# Patient Record
Sex: Male | Born: 1980 | Race: Black or African American | Hispanic: No | Marital: Single | Smoking: Current every day smoker
Health system: Southern US, Community
[De-identification: ages and names within clinical notes are randomized; demographics above are authoritative.]

## PROBLEM LIST (undated history)

## (undated) DIAGNOSIS — M545 Low back pain, unspecified: Secondary | ICD-10-CM

## (undated) DIAGNOSIS — J45909 Unspecified asthma, uncomplicated: Secondary | ICD-10-CM

## (undated) DIAGNOSIS — T7840XA Allergy, unspecified, initial encounter: Secondary | ICD-10-CM

## (undated) HISTORY — DX: Unspecified asthma, uncomplicated: J45.909

## (undated) HISTORY — DX: Allergy, unspecified, initial encounter: T78.40XA

## (undated) HISTORY — PX: HAND SURGERY: SHX662

---
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
## 2004-09-22 ENCOUNTER — Emergency Department (HOSPITAL_COMMUNITY): Admission: EM | Admit: 2004-09-22 | Discharge: 2004-09-22 | Payer: Self-pay | Admitting: Emergency Medicine

## 2007-10-10 ENCOUNTER — Emergency Department (HOSPITAL_COMMUNITY): Admission: EM | Admit: 2007-10-10 | Discharge: 2007-10-11 | Payer: Self-pay | Admitting: Emergency Medicine

## 2007-11-13 ENCOUNTER — Emergency Department (HOSPITAL_COMMUNITY): Admission: EM | Admit: 2007-11-13 | Discharge: 2007-11-14 | Payer: Self-pay | Admitting: Emergency Medicine

## 2007-11-13 ENCOUNTER — Emergency Department (HOSPITAL_COMMUNITY): Admission: EM | Admit: 2007-11-13 | Discharge: 2007-11-13 | Payer: Self-pay | Admitting: Emergency Medicine

## 2008-06-19 ENCOUNTER — Emergency Department (HOSPITAL_COMMUNITY): Admission: EM | Admit: 2008-06-19 | Discharge: 2008-06-20 | Payer: Self-pay | Admitting: Emergency Medicine

## 2008-08-01 ENCOUNTER — Emergency Department (HOSPITAL_COMMUNITY): Admission: EM | Admit: 2008-08-01 | Discharge: 2008-08-01 | Payer: Self-pay | Admitting: Emergency Medicine

## 2010-03-25 ENCOUNTER — Emergency Department (HOSPITAL_COMMUNITY)
Admission: EM | Admit: 2010-03-25 | Discharge: 2010-03-25 | Payer: Self-pay | Source: Home / Self Care | Admitting: Emergency Medicine

## 2010-06-29 ENCOUNTER — Emergency Department (HOSPITAL_COMMUNITY)
Admission: EM | Admit: 2010-06-29 | Discharge: 2010-06-29 | Disposition: A | Discharge status: Home / Self Care | Payer: Worker's Compensation | Attending: Emergency Medicine | Admitting: Emergency Medicine

## 2010-06-29 ENCOUNTER — Emergency Department (HOSPITAL_COMMUNITY): Payer: Worker's Compensation

## 2010-06-29 DIAGNOSIS — M25569 Pain in unspecified knee: Secondary | ICD-10-CM | POA: Insufficient documentation

## 2010-06-29 DIAGNOSIS — M25669 Stiffness of unspecified knee, not elsewhere classified: Secondary | ICD-10-CM | POA: Insufficient documentation

## 2010-06-29 DIAGNOSIS — I10 Essential (primary) hypertension: Secondary | ICD-10-CM | POA: Insufficient documentation

## 2010-06-29 DIAGNOSIS — IMO0002 Reserved for concepts with insufficient information to code with codable children: Secondary | ICD-10-CM | POA: Insufficient documentation

## 2010-06-29 DIAGNOSIS — S8000XA Contusion of unspecified knee, initial encounter: Secondary | ICD-10-CM | POA: Insufficient documentation

## 2010-07-15 LAB — URINALYSIS, ROUTINE W REFLEX MICROSCOPIC
Glucose, UA: NEGATIVE mg/dL
Nitrite: NEGATIVE
Specific Gravity, Urine: 1.025 (ref 1.005–1.030)
pH: 6 (ref 5.0–8.0)

## 2010-07-15 LAB — DIFFERENTIAL
Lymphocytes Relative: 27 % (ref 12–46)
Lymphs Abs: 2.3 10*3/uL (ref 0.7–4.0)
Monocytes Relative: 5 % (ref 3–12)
Neutrophils Relative %: 62 % (ref 43–77)

## 2010-07-15 LAB — COMPREHENSIVE METABOLIC PANEL WITH GFR
ALT: 10 U/L (ref 0–53)
AST: 19 U/L (ref 0–37)
Albumin: 3.8 g/dL (ref 3.5–5.2)
Alkaline Phosphatase: 67 U/L (ref 39–117)
BUN: 11 mg/dL (ref 6–23)
CO2: 31 meq/L (ref 19–32)
Calcium: 9.3 mg/dL (ref 8.4–10.5)
Chloride: 104 meq/L (ref 96–112)
Creatinine, Ser: 0.95 mg/dL (ref 0.4–1.5)
GFR calc non Af Amer: >60 mL/min
Glucose, Bld: 91 mg/dL (ref 70–99)
Potassium: 4.2 meq/L (ref 3.5–5.1)
Sodium: 140 meq/L (ref 135–145)
Total Bilirubin: 0.5 mg/dL (ref 0.3–1.2)
Total Protein: 6.9 g/dL (ref 6.0–8.3)

## 2010-07-15 LAB — LIPASE, BLOOD: Lipase: 40 U/L (ref 11–59)

## 2010-07-15 LAB — CBC
MCHC: 33.5 g/dL (ref 30.0–36.0)
MCV: 87.1 fL (ref 78.0–100.0)
RBC: 4.91 MIL/uL (ref 4.22–5.81)
RDW: 14.4 % (ref 11.5–15.5)

## 2010-07-15 LAB — URINE MICROSCOPIC-ADD ON

## 2010-07-16 LAB — POCT I-STAT, CHEM 8
Creatinine, Ser: 1.3 mg/dL (ref 0.4–1.5)
HCT: 47 % (ref 39.0–52.0)
Hemoglobin: 16 g/dL (ref 13.0–17.0)
Sodium: 139 mEq/L (ref 135–145)
TCO2: 30 mmol/L (ref 0–100)

## 2010-07-23 ENCOUNTER — Emergency Department (HOSPITAL_COMMUNITY)
Admission: EM | Admit: 2010-07-23 | Discharge: 2010-07-23 | Disposition: A | Discharge status: Home / Self Care | Payer: Self-pay | Attending: Emergency Medicine | Admitting: Emergency Medicine

## 2010-07-23 DIAGNOSIS — M545 Low back pain, unspecified: Secondary | ICD-10-CM | POA: Insufficient documentation

## 2010-07-23 DIAGNOSIS — I10 Essential (primary) hypertension: Secondary | ICD-10-CM | POA: Insufficient documentation

## 2010-07-23 DIAGNOSIS — G44209 Tension-type headache, unspecified, not intractable: Secondary | ICD-10-CM | POA: Insufficient documentation

## 2010-07-23 DIAGNOSIS — S335XXA Sprain of ligaments of lumbar spine, initial encounter: Secondary | ICD-10-CM | POA: Insufficient documentation

## 2010-07-23 DIAGNOSIS — F17200 Nicotine dependence, unspecified, uncomplicated: Secondary | ICD-10-CM | POA: Insufficient documentation

## 2010-07-23 DIAGNOSIS — X58XXXA Exposure to other specified factors, initial encounter: Secondary | ICD-10-CM | POA: Insufficient documentation

## 2011-01-01 LAB — CBC
MCHC: 32.8
RBC: 4.55
WBC: 10.4

## 2011-01-01 LAB — DIFFERENTIAL
Basophils Relative: 0
Lymphocytes Relative: 30
Monocytes Relative: 6
Neutro Abs: 6.3
Neutrophils Relative %: 60

## 2013-03-13 ENCOUNTER — Encounter (HOSPITAL_COMMUNITY): Payer: Self-pay | Admitting: Emergency Medicine

## 2013-03-13 ENCOUNTER — Emergency Department (HOSPITAL_COMMUNITY)
Admission: EM | Admit: 2013-03-13 | Discharge: 2013-03-13 | Disposition: A | Discharge status: Home / Self Care | Payer: Self-pay | Attending: Emergency Medicine | Admitting: Emergency Medicine

## 2013-03-13 DIAGNOSIS — M545 Low back pain, unspecified: Secondary | ICD-10-CM | POA: Insufficient documentation

## 2013-03-13 DIAGNOSIS — A749 Chlamydial infection, unspecified: Secondary | ICD-10-CM | POA: Insufficient documentation

## 2013-03-13 DIAGNOSIS — Z202 Contact with and (suspected) exposure to infections with a predominantly sexual mode of transmission: Secondary | ICD-10-CM

## 2013-03-13 DIAGNOSIS — F17200 Nicotine dependence, unspecified, uncomplicated: Secondary | ICD-10-CM | POA: Insufficient documentation

## 2013-03-13 DIAGNOSIS — G8929 Other chronic pain: Secondary | ICD-10-CM | POA: Insufficient documentation

## 2013-03-13 LAB — URINALYSIS, ROUTINE W REFLEX MICROSCOPIC
Hgb urine dipstick: NEGATIVE
Nitrite: NEGATIVE
Specific Gravity, Urine: 1.027 (ref 1.005–1.030)
Urobilinogen, UA: 1 mg/dL (ref 0.0–1.0)

## 2013-03-13 LAB — GC/CHLAMYDIA PROBE AMP
CT Probe RNA: POSITIVE — AB
GC Probe RNA: NEGATIVE

## 2013-03-13 LAB — URINE MICROSCOPIC-ADD ON

## 2013-03-13 MED ORDER — AZITHROMYCIN 250 MG PO TABS
1000.0000 mg | ORAL_TABLET | Freq: Once | ORAL | Status: AC
  Administered 2013-03-13: 1000 mg via ORAL
  Filled 2013-03-13: qty 4

## 2013-03-13 MED ORDER — CEFTRIAXONE SODIUM 250 MG IJ SOLR
250.0000 mg | Freq: Once | INTRAMUSCULAR | Status: AC
  Administered 2013-03-13: 250 mg via INTRAMUSCULAR
  Filled 2013-03-13: qty 250

## 2013-03-13 MED ORDER — METRONIDAZOLE 500 MG PO TABS
2000.0000 mg | ORAL_TABLET | Freq: Once | ORAL | Status: AC
  Administered 2013-03-13: 2000 mg via ORAL
  Filled 2013-03-13: qty 4

## 2013-03-13 MED ORDER — CYCLOBENZAPRINE HCL 10 MG PO TABS: 10.0000 mg | ORAL_TABLET | Freq: Three times a day (TID) | ORAL | Status: DC | PRN

## 2013-03-13 MED ORDER — LIDOCAINE HCL (PF) 1 % IJ SOLN
INTRAMUSCULAR | Status: AC
  Administered 2013-03-13: 0.9 mL
  Filled 2013-03-13: qty 5

## 2013-03-13 NOTE — ED Notes (Signed)
Pt state that for several years he has been having back pain off and on. Pt also believes that he has chlamydia because the person that gave it to him called him to tell him that she had it. Pt states that he believes it is affecting his kidney because when he urinates it is dark and strong.

## 2013-03-13 NOTE — ED Notes (Addendum)
C/o low back pain, recurrent, chronic. Also here for penile d/c, urinary frequency, malodorous urine & dysuria, onset 3-4d ago. (Denies: fever, nvd), no meds PTA. Pt "believes sx to be chlamydia".

## 2013-03-13 NOTE — ED Provider Notes (Signed)
Medical screening examination/treatment/procedure(s) were performed by non-physician practitioner and as supervising physician I was immediately available for consultation/collaboration.    Olivia Mackie, MD 03/13/13 [phone]

## 2013-03-13 NOTE — ED Provider Notes (Signed)
CSN: [id number]     Arrival date & time 03/13/13  0017 History   First MD Initiated Contact with Patient 03/13/13 0041     Chief Complaint  Patient presents with  . Back Pain  . Penile Discharge   HPI  History provided by the patient. Patient is a 32 year old male presents with concerns for STD exposure and penial discharge. Patient reports having penile discharge with slight motor for the past 4 days. He reports recently being notified by a sexual partner that she had positive Chlamydia. Patient believes he also has Chlamydia symptoms. He has slight dysuria. Denies any hematuria. Denies any rash of the skin. Denies any recent fever, chills or sweats and some continued chronic low back pain. He reports having this for many years and was told this was related to muscle strain and from his work as a Special educational needs teacher. Patient denies any specific strenuous activity were to worsen his pain does complain of low back pain and soreness worse with movement. This does not radiate. Denies any weakness or numbness in extremities. No urinary or fecal incontinence, urinary retention or perineal numbness.    History reviewed. No pertinent past medical history. Past Surgical History  Procedure Laterality Date  . Hand surgery Right    History reviewed. No pertinent family history. History  Substance Use Topics  . Smoking status: Current Every Day Smoker  . Smokeless tobacco: Not on file  . Alcohol Use: No    Review of Systems  Constitutional: Negative for fever, chills and diaphoresis.  All other systems reviewed and are negative.    Allergies  Review of patient's allergies indicates no known allergies.  Home Medications  No current outpatient prescriptions on file. BP 150/82  Pulse 90  Temp(Src) 97.3 F (36.3 C) (Oral)  Resp 18  Ht 6\' 2"  (1.88 m)  Wt 197 lb 5 oz (89.5 kg)  BMI 25.32 kg/m2  SpO2 96% Physical Exam  Nursing note and vitals reviewed. Constitutional: He appears  well-developed and well-nourished.  HENT:  Head: Normocephalic.  Cardiovascular: Normal rate and regular rhythm.   Pulmonary/Chest: Effort normal and breath sounds normal. No respiratory distress.  Abdominal: Soft. There is no tenderness. There is no CVA tenderness.  Genitourinary: Testes normal. Discharge found.  Musculoskeletal:       Lumbar back: He exhibits tenderness. He exhibits no bony tenderness, no swelling and no deformity.       Back:  Neurological: He is alert.  Skin: Skin is warm.  Psychiatric: He has a normal mood and affect. His behavior is normal.    ED Course  Procedures   COORDINATION OF CARE:  Nursing notes reviewed. Vital signs reviewed. Initial pt interview and examination performed.   Patient seen and evaluated. Patient well appearing. Patient reports exposure to partner with chlamydia. Having penile discharge. Discussed work up plan with pt at bedside, which includes UA, GC Chlamydia. Pt agrees with plan.  UA does demonstrate increased WBC. Significant bacteria. Patient was history consistent with STD exposure. This is likely a cause of increased WBC. At this time will treat for gonorrhea, Chlamydia and Trichomonas. We'll wait for urine cultures to assess need for any additional treatment of UTI. Patient instructed followup with [facility] department and agrees.   Treatment plan initiated: Medications  cefTRIAXone (ROCEPHIN) injection 250 mg (250 mg Intramuscular Given 03/13/13 0202)  azithromycin (ZITHROMAX) tablet 1,000 mg (1,000 mg Oral Given 03/13/13 0202)  metroNIDAZOLE (FLAGYL) tablet 2,000 mg (2,000 mg Oral Given 03/13/13 0202)  lidocaine (PF) (XYLOCAINE) 1 % injection (0.9 mLs  Given 03/13/13 0208)     Results for orders placed during the hospital encounter of 03/13/13  URINALYSIS, ROUTINE W REFLEX MICROSCOPIC      Result Value Range   Color, Urine YELLOW  YELLOW   APPearance CLOUDY (*) CLEAR   Specific Gravity, Urine 1.027  1.005 -  1.030   pH 7.0  5.0 - 8.0   Glucose, UA NEGATIVE  NEGATIVE mg/dL   Hgb urine dipstick NEGATIVE  NEGATIVE   Bilirubin Urine NEGATIVE  NEGATIVE   Ketones, ur NEGATIVE  NEGATIVE mg/dL   Protein, ur NEGATIVE  NEGATIVE mg/dL   Urobilinogen, UA 1.0  0.0 - 1.0 mg/dL   Nitrite NEGATIVE  NEGATIVE   Leukocytes, UA SMALL (*) NEGATIVE  URINE MICROSCOPIC-ADD ON      Result Value Range   Squamous Epithelial / LPF RARE  RARE   WBC, UA 11-20  <3 WBC/hpf   RBC / HPF 0-2  <3 RBC/hpf   Bacteria, UA RARE  RARE   Urine-Other MUCOUS PRESENT        MDM   1. Exposure to STD   2. Chlamydia   3. Low back pain        Angus Seller, PA-C 03/13/13 0502

## 2013-03-15 NOTE — ED Notes (Signed)
+   Chlamydia Patient treated with Rocephin And Zithromax-[facility] faxed 

## 2013-03-16 ENCOUNTER — Telehealth (HOSPITAL_COMMUNITY): Payer: Self-pay | Admitting: Emergency Medicine

## 2013-03-16 NOTE — ED Notes (Signed)
Unable to contact patient via phone. Sent letter. °

## 2013-04-05 ENCOUNTER — Emergency Department (HOSPITAL_COMMUNITY)
Admission: EM | Admit: 2013-04-05 | Discharge: 2013-04-05 | Disposition: A | Discharge status: Home / Self Care | Payer: Self-pay | Attending: Emergency Medicine | Admitting: Emergency Medicine

## 2013-04-05 ENCOUNTER — Encounter (HOSPITAL_COMMUNITY): Payer: Self-pay | Admitting: Emergency Medicine

## 2013-04-05 DIAGNOSIS — Z202 Contact with and (suspected) exposure to infections with a predominantly sexual mode of transmission: Secondary | ICD-10-CM

## 2013-04-05 DIAGNOSIS — Z8619 Personal history of other infectious and parasitic diseases: Secondary | ICD-10-CM | POA: Insufficient documentation

## 2013-04-05 DIAGNOSIS — F17200 Nicotine dependence, unspecified, uncomplicated: Secondary | ICD-10-CM | POA: Insufficient documentation

## 2013-04-05 MED ORDER — AZITHROMYCIN 250 MG PO TABS
1000.0000 mg | ORAL_TABLET | Freq: Once | ORAL | Status: AC
  Administered 2013-04-05: 1000 mg via ORAL
  Filled 2013-04-05: qty 4

## 2013-04-05 MED ORDER — LIDOCAINE HCL (PF) 1 % IJ SOLN
5.0000 mL | Freq: Once | INTRAMUSCULAR | Status: AC
  Administered 2013-04-05: 5 mL
  Filled 2013-04-05: qty 5

## 2013-04-05 MED ORDER — CEFTRIAXONE SODIUM 250 MG IJ SOLR
250.0000 mg | Freq: Once | INTRAMUSCULAR | Status: AC
  Administered 2013-04-05: 250 mg via INTRAMUSCULAR
  Filled 2013-04-05: qty 250

## 2013-04-05 NOTE — ED Provider Notes (Signed)
Medical screening examination/treatment/procedure(s) were performed by non-physician practitioner and as supervising physician I was immediately available for consultation/collaboration.  EKG Interpretation   None         Maili Shutters B. Bernette MayersSheldon, MD 04/05/13 [phone]

## 2013-04-05 NOTE — ED Provider Notes (Signed)
CSN: [id number]     Arrival date & time 04/05/13  [id number] History  This chart was scribed for non-physician practitioner Fayrene HelperBowie Alexys Gassett, PA-C, working with Bonnita Levanharles B. Bernette MayersSheldon, MD, by Yevette EdwardsAngela Bracken, ED Scribe. This patient was seen in room TR05C/TR05C and the patient's care was started at 9:35 AM.  First MD Initiated Contact with Patient 04/05/13 0930     Chief Complaint  Patient presents with  . Exposure to STD   The history is provided by the patient. No language interpreter was used.   HPI Comments: Donia PoundsBrandon C Pesantez is a 33 y.o. male who presents to the Emergency Department complaining of a possible STD exposure. The pt was diagnosed and treated for chlamydia approximately a month ago after experiencing penile discharge and penile pain for approximately a week. After receiving treatment, he had sexual intercourse two days later with the same sexual partner, and he is concerned that he may have contracted chlamydia again. He denies penile discharge, penile pain, back pain, nausea, emesis, fever, chills, or a rash. He is a current smoker.   History reviewed. No pertinent past medical history. Past Surgical History  Procedure Laterality Date  . Hand surgery Right    History reviewed. No pertinent family history. History  Substance Use Topics  . Smoking status: Current Every Day Smoker  . Smokeless tobacco: Not on file  . Alcohol Use: No    Review of Systems  Constitutional: Negative for fever and chills.  Gastrointestinal: Negative for abdominal pain.  Genitourinary: Negative for discharge and penile pain.  Musculoskeletal: Negative for back pain.  Skin: Negative for rash.  All other systems reviewed and are negative.    Allergies  Review of patient's allergies indicates no known allergies.  Home Medications   Current Outpatient Rx  Name  Route  Sig  Dispense  Refill  . cyclobenzaprine (FLEXERIL) 10 MG tablet   Oral   Take 1 tablet (10 mg total) by mouth 3 (three) times daily  as needed for muscle spasms.   30 tablet   0    Triage Vitals: BP 152/87  Pulse 94  Temp(Src) 98.2 F (36.8 C) (Oral)  Resp 18  SpO2 98%  Physical Exam  Nursing note and vitals reviewed. Constitutional: He is oriented to person, place, and time. He appears well-developed and well-nourished. No distress.  HENT:  Head: Normocephalic and atraumatic.  Eyes: EOM are normal.  Neck: Neck supple. No tracheal deviation present.  Cardiovascular: Normal rate, regular rhythm, normal heart sounds and intact distal pulses.   No murmur heard. Pulmonary/Chest: Effort normal. No respiratory distress. He has no rales.  Mild rhonchi, only with inspiration.   Musculoskeletal: Normal range of motion.  Neurological: He is alert and oriented to person, place, and time.  Skin: Skin is warm and dry.  Psychiatric: He has a normal mood and affect. His behavior is normal.    ED Course  Procedures (including critical care time)  DIAGNOSTIC STUDIES: Oxygen Saturation is 98% on room air, normal by my interpretation.    COORDINATION OF CARE:  9:38 AM- Discussed treatment plan with patient, which includes treatment for chlamydia and the reminder to avoid sexual activity for at least a week, and the patient agreed to the plan.   Labs Review Labs Reviewed - No data to display Imaging Review No results found.  EKG Interpretation   None       MDM   1. Possible exposure to STD    BP 152/87  Pulse 94  Temp(Src) 98.2 F (36.8 C) (Oral)  Resp 18  SpO2 98%   I personally performed the services described in this documentation, which was scribed in my presence. The recorded information has been reviewed and is accurate.     Fayrene Helper, PA-C 04/05/13 1601

## 2013-04-05 NOTE — ED Notes (Signed)
Pt sts requesting STD check; pt sts hx of chlamydia

## 2013-04-05 NOTE — Discharge Instructions (Signed)
Sexually Transmitted Disease °Sexually transmitted disease (STD) refers to any infection that is passed from person to person during sexual activity. This may happen by way of saliva, semen, blood, vaginal mucus, or urine. Common STDs include: °· Gonorrhea. °· Chlamydia. °· Syphilis. °· HIV/AIDS. °· Genital herpes. °· Hepatitis B and C. °· Trichomonas. °· Human papillomavirus (HPV). °· Pubic lice. °CAUSES  °An STD may be spread by bacteria, virus, or parasite. A person can get an STD by: °· Sexual intercourse with an infected person. °· Sharing sex toys with an infected person. °· Sharing needles with an infected person. °· Having intimate contact with the genitals, mouth, or rectal areas of an infected person. °SYMPTOMS  °Some people may not have any symptoms, but they can still pass the infection to others. Different STDs have different symptoms. Symptoms include: °· Painful or bloody urination. °· Pain in the pelvis, abdomen, vagina, anus, throat, or eyes. °· Skin rash, itching, irritation, growths, or sores (lesions). These usually occur in the genital or anal area. °· Abnormal vaginal discharge. °· Penile discharge in men. °· Soft, flesh-colored skin growths in the genital or anal area. °· Fever. °· Pain or bleeding during sexual intercourse. °· Swollen glands in the groin area. °· Yellow skin and eyes (jaundice). This is seen with hepatitis. °DIAGNOSIS  °To make a diagnosis, your caregiver may: °· Take a medical history. °· Perform a physical exam. °· Take a specimen (culture) to be examined. °· Examine a sample of discharge under a microscope. °· Perform blood tests. °· Perform a Pap test, if this applies. °· Perform a colposcopy. °· Perform a laparoscopy. °TREATMENT  °· Chlamydia, gonorrhea, trichomonas, and syphilis can be cured with antibiotic medicine. °· Genital herpes, hepatitis, and HIV can be treated, but not cured, with prescribed medicines. The medicines will lessen the symptoms. °· Genital warts  from HPV can be treated with medicine or by freezing, burning (electrocautery), or surgery. Warts may come back. °· HPV is a virus and cannot be cured with medicine or surgery. However, abnormal areas may be followed very closely by your caregiver and may be removed from the cervix, vagina, or vulva through office procedures or surgery. °If your diagnosis is confirmed, your recent sexual partners need treatment. This is true even if they are symptom-free or have a negative culture or evaluation. They should not have sex until their caregiver says it is okay. °HOME CARE INSTRUCTIONS °· All sexual partners should be informed, tested, and treated for all STDs. °· Take your antibiotics as directed. Finish them even if you start to feel better. °· Only take over-the-counter or prescription medicines for pain, discomfort, or fever as directed by your caregiver. °· Rest. °· Eat a balanced diet and drink enough fluids to keep your urine clear or pale yellow. °· Do not have sex until treatment is completed and you have followed up with your caregiver. STDs should be checked after treatment. °· Keep all follow-up appointments, Pap tests, and blood tests as directed by your caregiver. °· Only use latex condoms and water-soluble lubricants during sexual activity. Do not use petroleum jelly or oils. °· Avoid alcohol and illegal drugs. °· Get vaccinated for HPV and hepatitis. If you have not received these vaccines in the past, talk to your caregiver about whether one or both might be right for you. °· Avoid risky sex practices that can break the skin. °The only way to avoid getting an STD is to avoid all sexual activity. Latex condoms and dental   dams (for oral sex) will help lessen the risk of getting an STD, but will not completely eliminate the risk. °SEEK MEDICAL CARE IF:  °· You have a fever. °· You have any new or worsening symptoms. °Document Released: 06/12/2002 Document Revised: 06/14/2011 Document Reviewed:  10/10/2012 °[employer]® Patient Information ©2014 [employer]. ° °

## 2013-10-10 ENCOUNTER — Encounter (HOSPITAL_COMMUNITY): Payer: Self-pay | Admitting: Emergency Medicine

## 2013-10-10 ENCOUNTER — Emergency Department (HOSPITAL_COMMUNITY)
Admission: EM | Admit: 2013-10-10 | Discharge: 2013-10-10 | Disposition: A | Discharge status: Home / Self Care | Payer: Self-pay | Attending: Emergency Medicine | Admitting: Emergency Medicine

## 2013-10-10 DIAGNOSIS — F17200 Nicotine dependence, unspecified, uncomplicated: Secondary | ICD-10-CM | POA: Insufficient documentation

## 2013-10-10 DIAGNOSIS — M545 Low back pain, unspecified: Secondary | ICD-10-CM | POA: Insufficient documentation

## 2013-10-10 LAB — CBG MONITORING, ED: GLUCOSE-CAPILLARY: 107 mg/dL — AB (ref 70–99)

## 2013-10-10 MED ORDER — HYDROCODONE-ACETAMINOPHEN 5-325 MG PO TABS: 1.0000 | ORAL_TABLET | ORAL | Status: DC | PRN

## 2013-10-10 MED ORDER — NAPROXEN 500 MG PO TABS: 500.0000 mg | ORAL_TABLET | Freq: Two times a day (BID) | ORAL | Status: DC

## 2013-10-10 NOTE — ED Provider Notes (Signed)
Medical screening examination/treatment/procedure(s) were performed by non-physician practitioner and as supervising physician I was immediately available for consultation/collaboration.   EKG Interpretation None        Nithin Demeo M Judy Pollman, MD 10/10/13 1554 

## 2013-10-10 NOTE — ED Provider Notes (Signed)
CSN: [id number]     Arrival date & time 10/10/13  0841 History   First MD Initiated Contact with Patient 10/10/13 [phone]     Chief Complaint  Patient presents with  . Back Pain   HPI Comments: Thomas [name] is a 33 y.o. male who presents to the Emergency Department complaining of recurrent lower back pain x 3 days. The patient reports similar discomfort in the past several years.  Pt reports pain worsens with bending forward and certain movements. Pt denies recent injuries. States he does heavy lifting at work daily. Pt takes about 4 ibuprofen at a times without relief to pain. Pt denies wearing back brace. Pt denies numbness and weakness in leg. Denies injury. Pt shows concerned of DM and is requesting a CBG. He reports family h/o DM. Pt reports increase in thirst.   Patient is a 33 y.o. male presenting with back pain. The history is provided by the patient. No language interpreter was used.  Back Pain Associated symptoms: no fever, no numbness and no weakness    This chart was scribed for non-physician practitioner, Mellody DrownLauren Patrich Heinze, PA-C working with Lyanne CoKevin M Campos, MD, by Andrew Auaven Small, ED Scribe. This patient was seen in room TR06C/TR06C and the patient's care was started at 9:28 AM.   History reviewed. No pertinent past medical history. Past Surgical History  Procedure Laterality Date  . Hand surgery Right    History reviewed. No pertinent family history. History  Substance Use Topics  . Smoking status: Current Every Day Smoker  . Smokeless tobacco: Not on file  . Alcohol Use: No    Review of Systems  Constitutional: Negative for fever and chills.  Musculoskeletal: Positive for back pain. Negative for gait problem.  Skin: Negative for wound.  Neurological: Negative for weakness and numbness.  All other systems reviewed and are negative.  Allergies  Review of patient's allergies indicates no known allergies.  Home Medications   Prior to Admission medications   Not on File    BP 135/75  Pulse 97  Temp(Src) 98.3 F (36.8 C)  Resp 18  Wt 189 lb (85.73 kg)  SpO2 99% Physical Exam  Nursing note and vitals reviewed. Constitutional: He is oriented to person, place, and time. He appears well-developed and well-nourished.  Non-toxic appearance. He does not have a sickly appearance. He does not appear ill. No distress.  HENT:  Head: Normocephalic and atraumatic.  Eyes: EOM are normal. Pupils are equal, round, and reactive to light.  Neck: Normal range of motion. Neck supple.  Pulmonary/Chest: Effort normal. No respiratory distress.  Musculoskeletal: Normal range of motion.  No midline C-spine, T-spine, or L-spine tenderness with no step-offs, crepitus, or deformities noted. Mild tenderness palpation of paravertebral muscles, no spasms. Left lower lumbar soft tissue mobile mass, nontender. Good and equal strength and sensation to bilateral lower extremities.   Neurological: He is alert and oriented to person, place, and time.  Skin: Skin is warm and dry. He is not diaphoretic.  Psychiatric: He has a normal mood and affect. His behavior is normal.    ED Course  Procedures  DIAGNOSTIC STUDIES: Oxygen Saturation is 99% on RA, normal by my interpretation.    COORDINATION OF CARE: 9:31 AM-Discussed treatment plan which includes CBG with pt at bedside and pt agreed to plan.   CBG 107   MDM   Final diagnoses:  Bilateral low back pain without sciatica   Patient with back pain.  No neurological deficits and normal neuro  exam.  Likely strain  No loss of bowel or bladder control.  No concern for cauda equina.  No fever, night sweats, weight loss, h/o cancer, IVDU.  RICE protocol and pain medicine indicated and discussed with patient. Patient reports family history of diabetes, denies diabetes symptoms discussed A1c and followup with PCP. Will obtain CBG. CBG 107. PCP resources given.  Meds given in ED:  Medications - No data to display  Discharge Medication  List as of 10/10/2013  9:43 AM    START taking these medications   Details  HYDROcodone-acetaminophen (NORCO/VICODIN) 5-325 MG per tablet Take 1 tablet by mouth every 4 (four) hours as needed for moderate pain or severe pain., Starting 10/10/2013, Until Discontinued, Print    naproxen (NAPROSYN) 500 MG tablet Take 1 tablet (500 mg total) by mouth 2 (two) times daily with a meal., Starting 10/10/2013, Until Discontinued, Print        I personally performed the services described in this documentation, which was scribed in my presence. The recorded information has been reviewed and is accurate.      Clabe SealLauren M Karole Oo, PA-C 10/10/13 1046

## 2013-10-10 NOTE — Discharge Instructions (Signed)
Your blood glucose was 107, which is well within the normal limits for a non-fasting blood glucose testing. Call for a follow up appointment with a Family or Primary Care Provider.  Return if Symptoms worsen.   Take medication as prescribed.  Ice your back 3-4 times a day.    Emergency Department Resource Guide 1) Find a Doctor and Pay Out of Pocket Although you won't have to find out who is covered by your insurance plan, it is a good idea to ask around and get recommendations. You will then need to call the office and see if the doctor you have chosen will accept you as a new patient and what types of options they offer for patients who are self-pay. Some doctors offer discounts or will set up payment plans for their patients who do not have insurance, but you will need to ask so you aren't surprised when you get to your appointment.  2) Contact Your Local Health Department Not all health departments have doctors that can see patients for sick visits, but many do, so it is worth a call to see if yours does. If you don't know where your local health department is, you can check in your phone book. The CDC also has a tool to help you locate your state's health department, and many state websites also have listings of all of their local health departments.  3) Find a Walk-in Clinic If your illness is not likely to be very severe or complicated, you may want to try a walk in clinic. These are popping up all over the country in pharmacies, drugstores, and shopping centers. They're usually staffed by nurse practitioners or physician assistants that have been trained to treat common illnesses and complaints. They're usually fairly quick and inexpensive. However, if you have serious medical issues or chronic medical problems, these are probably not your best option.  No Primary Care Doctor: - Call Health Connect at  [phone] - they can help you locate a primary care doctor that  accepts your insurance,  provides certain services, etc. - Physician Referral Service- [phone]  Chronic Pain Problems: Organization         Address  Phone   Notes  [facility]  [phone]) [phone] Patients need to be referred by their primary care doctor.   Medication Assistance: Organization         Address  Phone   Notes  [facility] Medication [facility] [address] [place], [place] [address] [phone]) [phone] --Must be a resident of [facility] [place] -- Must have NO insurance coverage whatsoever (no Medicaid/ Medicare, etc.) -- The pt. MUST have a primary care doctor that directs their care regularly and follows them in the community   MedAssist  [phone]   [employer]  [phone]    Agencies that provide inexpensive medical care: Organization         Address  Phone   Notes  [facility]  [phone]   [facility]    [phone]) [phone]   [facility] [address] [place], [place] [address] [phone]   [facility] [place] [address], [place] [phone]   Planned Parenthood    [phone]   [facility]    [phone]) [phone]   [facility]  [address], [place] Phone:  [phone]) [phone], Fax:  [phone] Hours of Operation:  9 am - 6 pm, M-F.  Also accepts Medicaid/Medicare and self-pay.  [facility]  [address], [place] Phone: [phone], Fax: [phone]) [phone]. Hours of Operation:  8:30 am - 5:30 pm, M-F.  Also accepts Medicaid and self-pay.  [facility] [place] [address], [place] Phone: [phone]) [phone]   [facility] [address], [place], [place] [phone], Ext. [phone] Mondays & Thursdays: 7-9 AM.  First 15 patients are seen on a first come, first serve basis.    Medicaid-accepting [facility] Providers:  Organization         Address  Phone    Notes  [facility] [address], [place] [phone]) [phone] Also accepts self-pay patients.  [facility] [address], [place]  [phone]) [phone]   [facility] [address], [place] [phone]   [facility] [address] [place] [address], [place] [phone]   Renaye RakersVeita Bland [address], [place]   [phone] Only accepts [place] Access [place] patients after they have their name applied to their card.   Self-Pay (no insurance) in [facility]:  Organization         Address  Phone   Notes  Sickle Cell Patients, [facility] [address], [place] [phone]   [facility] [address], [place] [phone]   [facility] [place]  [address], [place] [phone]) [phone]   [facility]/Dr. Osei-Bonsu  [address] [place] [address], [place] or [phone], [place] [phone] Phone number for both [place] and [place] locations is the same.  [facility] [address], [place] [phone]) [phone]   [facility] [place] [address] [place] [address], [place] or [address] [phone]) [phone]   [facility] [address], [place] [phone]) [phone], phone; [phone]) [phone], fax Sees patients 1st and 3rd Saturday of every month.  Must not qualify for public or private insurance (i.e. Medicaid, Medicare, [place] Health Choice, Veterans' Benefits)  Household income should be no more than 200% of the poverty level The clinic cannot treat you if you are pregnant or think you are pregnant  Sexually transmitted diseases are not treated at the clinic.    Dental Care: Organization         Address  Phone  Notes  [facility] of [facility] [address], [place] [phone]  [phone] Accepts children up to age 31 who are enrolled in [place]icaid or [place] Health Choice; pregnant women with a Medicaid card; and children who have applied for Medicaid or [place] Health Choice, but were declined, whose parents can pay a reduced fee at time of service.  [facility] Department of [facility] [place]  [address], [place] [phone] Accepts children up to age 31 who are enrolled in [place]icaid or [place] Health Choice; pregnant women with a Medicaid card; and children who have applied for Medicaid or [place] Health Choice, but were declined, whose parents can pay a reduced fee at time of service.  [facility]  [address], [place] [phone] Patients are seen by appointment only. Walk-ins are not accepted. [facility] will see patients 18 years  of age and older. Monday - Tuesday (8am-5pm) Most Wednesdays (8:30-5pm) $30 per visit, cash only  [facility]  [address], [facility] [phone] Patients are seen by appointment only. Walk-ins are not accepted. [facility] will see patients 618 years of age and older. One Wednesday Evening (Monthly: Volunteer Based).  $30 per visit, cash only  [employer] [facility] [facility]  [phone] for adults; Children under age 784, call Graduate Pediatric Dentistry at [phone]) [phone]. Children aged 164-14, please call [phone] to request a pediatric application.  Dental services are provided in all areas of dental care including fillings, crowns and bridges, complete and partial dentures, implants, gum treatment, root canals, and extractions. Preventive care is also provided. Treatment is provided to both adults and children. Patients are selected via a lottery and there is often a waiting list.   [facility] [address], [place]  [phone]) [phone] [email or web address]   [facility] [address], [place], [place] [phone], Ext.  123 Second and Fourth Thursday of each month, opens at 6:30 AM; Clinic ends at 9 AM.  Patients are seen on a first-come first-served basis, and a limited number are seen during each clinic.   [facility]  [address], [place], [place] [phone]) [phone]   Eligibility Requirements You must have lived in [place], [place], or [place] counties for at least the last three months.   You cannot be eligible for state or federal sponsored National Cityhealthcare insurance, including CIGNAVeterans Administration, [place]icaid, or Har[employer] EntertainmentMedicare.   You generally cannot be eligible for healthcare insurance through your employer.    How to apply: Eligibility screenings are held every Tuesday and Wednesday afternoon from 1:00 pm until 4:00 pm. You do not need an appointment for the interview!  [facility] [address], [place], [place] [phone]   [facility]  [phone]   [facility]  [phone]   [facility]  [phone]    Behavioral Health Resources in the Community: Intensive Outpatient Programs Organization         Address  Phone  Notes  [facility] [address], [place], [place] [phone]   [facility] [address], [place], [place] [phone]   [facility] [address], [place], [place]  [phone]   [facility] [address],  [place], [place] [phone] or [phone]   Substance Abuse Resources Organization         Address  Phone  Notes  [facility] Drug Services  [phone]   [facility]  [phone]   [facility]  [phone]   [facility]  [phone]   Residential & Outpatient Substance Abuse Program  [phone]   Psychological Services Organization         Address  Phone  Notes  [facility]  [phone]   [facility]  [phone]   [facility]  Mental Health [address], [place] [phone] or [phone]    Mobile Crisis Teams Organization         Address  Phone  Notes  [facility], Mobile Crisis Care Unit  [phone]   [facility]  [address] [place], [place] [phone]   [facility] [address] [place] [phone]    Self-Help/Support Groups Organization  Address  Phone             Notes  [place] [facility] [place] - variety of support groups  [place] Call for more information  Narcotics Anonymous (NA), [facility]aring [facility] [address], [employer] [place]  2 meetings at this location   Special educational needs teacher         Address  Phone  Notes  [facility],    [place]  [phone]   [facility]  [address] [place] [address]1505, [place], [place]   [place], [place] [phone] Admissions: 8am-3pm M-F  Incentives Substance [place] [address],    [place], [place] [phone]   [facility] [address] [place], [place], [place]   [facility] [address]  [place], [place]   [facility]ight Programs - Intensive Outpatient [address], [place], [place]   [facility] [address].,  [place], [place] [phone] or [phone]   [facility] (RTS) [address] [place], [place] Accepts Medicaid  [facility] [address]  [place] [phone] Substance Abuse/Addiction Treatment   [facility] Organization         Address  Phone  Notes  [facility]  [phone]   Domenic Schwab, PhD [address] [place], [place]   [phone] or [phone]   [place] Rosa Sanchez  [place], [place] [phone]   [facility] [address] [place], [place] [phone] Insurance/Medicaid/sponsorship through [facility] and Families [address] [place] [address] [place]                                    [place], [place] [phone] [facility] [address] [place], [place] [phone]    Dr. Adele Schilder  [phone]   [facility]. 1) [address], [place] 2) [place] 3)  [address], [place] [phone]  [phone]   [place] [phone] or [phone] (After Hours)

## 2013-10-10 NOTE — ED Notes (Signed)
Per pt sts that he has been having lower back pain for years but it flared up about 3 days. Ago. 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

## 2014-06-08 ENCOUNTER — Ambulatory Visit (INDEPENDENT_AMBULATORY_CARE_PROVIDER_SITE_OTHER): Payer: Self-pay | Admitting: Family Medicine

## 2014-06-08 VITALS — BP 140/78 | HR 88 | Temp 98.2°F | Resp 16 | Ht 74.0 in | Wt 175.2 lb

## 2014-06-08 DIAGNOSIS — R000 Tachycardia, unspecified: Secondary | ICD-10-CM

## 2014-06-08 NOTE — Progress Notes (Signed)
[facility] [address], [place] [address] [phone]  Date:  06/08/2014   Name:  Thomas PoundsBrandon C [name]   DOB:  April 23, 1980   MRN:  [id number]  PCP:  No primary care provider on file.    Chief Complaint: Complete Forms for Plasma Donation   History of Present Illness:  Thomas [name] is a 34 y.o. very pleasant male patient who presents with the following:  Generally healthy young man here today to complete forms for plasma donation.  He is not on any medications and does not have any current health concerns as far as he is aware  History of asthma as a child but no longer. He does have some seasonal allergies still He was told that he could not donate plasma recently because of tachycardia when he went to donate.  He does not have more details about this but does have a form for me to complete.      There are no active problems to display for this patient.   Past Medical History  Diagnosis Date  . Allergy   . Asthma     Past Surgical History  Procedure Laterality Date  . Hand surgery Right     History  Substance Use Topics  . Smoking status: Current Every Day Smoker  . Smokeless tobacco: Never Used  . Alcohol Use: No    Family History  Problem Relation Age of Onset  . Diabetes Mother     No Known Allergies  Medication list has been reviewed and updated.  No current outpatient prescriptions on file prior to visit.   No current facility-administered medications on file prior to visit.    Review of Systems:  As per HPI- otherwise negative.   Physical Examination: Filed Vitals:   06/08/14 1619  BP: 140/78  Pulse: 88  Temp: 98.2 F (36.8 C)  Resp: 16   Filed Vitals:   06/08/14 1619  Height: 6\' 2"  (1.88 m)  Weight: 175 lb 4 oz (79.493 kg)   Body mass index is 22.49 kg/(m^2). Ideal Body Weight: Weight in (lb) to have BMI = 25: 194.3  GEN: WDWN, NAD, Non-toxic, A & O x 3, slim build, looks well.  He is a smoker HEENT:  Atraumatic, Normocephalic. Neck supple. No masses, No LAD.  Bilateral TM wnl, oropharynx normal.  PEERL,EOMI.   Ears and Nose: No external deformity. CV: RRR, No M/G/R. No JVD. No thrill. No extra heart sounds. PULM: CTA B, no wheezes, crackles, rhonchi. No retractions. No resp. distress. No accessory muscle use. ABD: S, NT, ND EXTR: No c/c/e NEURO Normal gait.  PSYCH: Normally interactive. Conversant. Not depressed or anxious appearing.  Calm demeanor.    Assessment and Plan: Tachycardia  No concerns about plasma donation,  His pulse is certainly normal today. Completed paperwork for plasma center for him today  Signed Abbe AmsterdamJessica Aniylah Avans, MD

## 2015-01-04 ENCOUNTER — Emergency Department (HOSPITAL_COMMUNITY)
Admission: EM | Admit: 2015-01-04 | Discharge: 2015-01-04 | Disposition: A | Discharge status: Home / Self Care | Payer: Self-pay | Attending: Emergency Medicine | Admitting: Emergency Medicine

## 2015-01-04 ENCOUNTER — Encounter (HOSPITAL_COMMUNITY): Payer: Self-pay

## 2015-01-04 DIAGNOSIS — X58XXXA Exposure to other specified factors, initial encounter: Secondary | ICD-10-CM | POA: Insufficient documentation

## 2015-01-04 DIAGNOSIS — S39012A Strain of muscle, fascia and tendon of lower back, initial encounter: Secondary | ICD-10-CM | POA: Insufficient documentation

## 2015-01-04 DIAGNOSIS — Y9389 Activity, other specified: Secondary | ICD-10-CM | POA: Insufficient documentation

## 2015-01-04 DIAGNOSIS — T148XXA Other injury of unspecified body region, initial encounter: Secondary | ICD-10-CM

## 2015-01-04 DIAGNOSIS — Z720 Tobacco use: Secondary | ICD-10-CM | POA: Insufficient documentation

## 2015-01-04 DIAGNOSIS — J45909 Unspecified asthma, uncomplicated: Secondary | ICD-10-CM | POA: Insufficient documentation

## 2015-01-04 DIAGNOSIS — Y9289 Other specified places as the place of occurrence of the external cause: Secondary | ICD-10-CM | POA: Insufficient documentation

## 2015-01-04 DIAGNOSIS — Y998 Other external cause status: Secondary | ICD-10-CM | POA: Insufficient documentation

## 2015-01-04 MED ORDER — IBUPROFEN 800 MG PO TABS: 800.0000 mg | ORAL_TABLET | Freq: Three times a day (TID) | ORAL | Status: DC

## 2015-01-04 MED ORDER — CYCLOBENZAPRINE HCL 10 MG PO TABS: 10.0000 mg | ORAL_TABLET | Freq: Two times a day (BID) | ORAL | Status: DC | PRN

## 2015-01-04 NOTE — ED Notes (Signed)
Pt reports onset 1 week ago lower back pain.  Pain worse when bending over and trying to get back up.  No radiation.  No known injury.  Pt works loading and unloading trucks.

## 2015-01-04 NOTE — ED Provider Notes (Signed)
CSN: [id number]     Arrival date & time 01/04/15  1303 History  By signing my name below, I, [name], attest that this documentation has been prepared under the direction and in the presence of Teressa Lower, NP Electronically Signed: [name], ED Scribe. 01/04/2015. 1:14 PM.   No chief complaint on file.     The history is provided by the patient. No language interpreter was used.    HPI Comments: Thomas [name] is a 34 y.o. male who presents to the Emergency Department complaining of low back pain onset 1 week. 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 He notes that he has had back pain in the past and was informed that he had a strain, he also notes that he hasn't been seen for awhile. He denies trauma or injury to the area. He states that he has not tried any medications for the relief for his symptoms. Pt denies bowel/bladder incontinence, fever, chills, CP, SOB, abdominal pain, n/v/d/c, hematuria, dysuria, numbness, tingling, weakness, and any other symptoms. Denies CA or IV drug use. Denies allergies to medications.    Past Medical History  Diagnosis Date  . Allergy   . Asthma    Past Surgical History  Procedure Laterality Date  . Hand surgery Right    Family History  Problem Relation Age of Onset  . Diabetes Mother    Social History  Substance Use Topics  . Smoking status: Current Every Day Smoker  . Smokeless tobacco: Never Used  . Alcohol Use: No    Review of Systems  All other systems reviewed and are negative.     Allergies  Review of patient's allergies indicates no known allergies.  Home Medications   Prior to Admission medications   Not on File   BP 148/85 mmHg  Pulse 103  Temp(Src) 98 F (36.7 C) (Oral)  Resp 18  SpO2 100% Physical Exam  Constitutional: He is oriented to person, place, and time. He appears well-developed and well-nourished. No distress.  HENT:  Head: Normocephalic and atraumatic.  Eyes: EOM are normal.   Neck: Neck supple.  Cardiovascular: Normal rate, regular rhythm and normal heart sounds.  Exam reveals no gallop and no friction rub.   No murmur heard. Pulmonary/Chest: Effort normal. No respiratory distress. He has no wheezes. He has no rales.  Musculoskeletal: Normal range of motion.  Lumbar paraspinal tenderness. Equal strength bilaterally. Good sensation.   Neurological: He is alert and oriented to person, place, and time.  Skin: Skin is warm and dry.  Psychiatric: He has a normal mood and affect. His behavior is normal.  Nursing note and vitals reviewed.   ED Course  Procedures (including critical care time) DIAGNOSTIC STUDIES:  COORDINATION OF CARE: 1:13 PM Discussed treatment plan with pt at bedside which includes muscle relaxer Rx, anti-inflammatory Rx, and f/u with orthopedist PRN and pt agreed to plan.    Labs Review Labs Reviewed - No data to display  Imaging Review No results found. I have personally reviewed and evaluated these images and lab results as part of my medical decision-making.   EKG Interpretation None      MDM   Final diagnoses:  Muscle strain    No red flag symptoms.pt is neurologically intact. Will treat with flexeril and ibuprofen  I personally performed the services described in this documentation, which was scribed in my presence. The recorded information has been reviewed and is accurate.    Teressa Lower, NP 01/04/15 1326  Eber Hong, MD 01/07/15 [phone]

## 2015-01-04 NOTE — Discharge Instructions (Signed)

## 2015-01-13 ENCOUNTER — Encounter (HOSPITAL_COMMUNITY): Payer: Self-pay | Admitting: Emergency Medicine

## 2015-01-13 ENCOUNTER — Emergency Department (HOSPITAL_COMMUNITY)
Admission: EM | Admit: 2015-01-13 | Discharge: 2015-01-13 | Disposition: A | Discharge status: Home / Self Care | Payer: Self-pay | Attending: Emergency Medicine | Admitting: Emergency Medicine

## 2015-01-13 DIAGNOSIS — Y999 Unspecified external cause status: Secondary | ICD-10-CM | POA: Insufficient documentation

## 2015-01-13 DIAGNOSIS — J45909 Unspecified asthma, uncomplicated: Secondary | ICD-10-CM | POA: Insufficient documentation

## 2015-01-13 DIAGNOSIS — S39012D Strain of muscle, fascia and tendon of lower back, subsequent encounter: Secondary | ICD-10-CM | POA: Insufficient documentation

## 2015-01-13 DIAGNOSIS — Z791 Long term (current) use of non-steroidal anti-inflammatories (NSAID): Secondary | ICD-10-CM | POA: Insufficient documentation

## 2015-01-13 DIAGNOSIS — Y9389 Activity, other specified: Secondary | ICD-10-CM | POA: Insufficient documentation

## 2015-01-13 DIAGNOSIS — Y9289 Other specified places as the place of occurrence of the external cause: Secondary | ICD-10-CM | POA: Insufficient documentation

## 2015-01-13 DIAGNOSIS — Z720 Tobacco use: Secondary | ICD-10-CM | POA: Insufficient documentation

## 2015-01-13 DIAGNOSIS — X58XXXA Exposure to other specified factors, initial encounter: Secondary | ICD-10-CM | POA: Insufficient documentation

## 2015-01-13 MED ORDER — KETOROLAC TROMETHAMINE 60 MG/2ML IM SOLN
30.0000 mg | Freq: Once | INTRAMUSCULAR | Status: AC
  Administered 2015-01-13: 30 mg via INTRAMUSCULAR
  Filled 2015-01-13: qty 2

## 2015-01-13 MED ORDER — HYDROCODONE-ACETAMINOPHEN 5-325 MG PO TABS: 1.0000 | ORAL_TABLET | Freq: Four times a day (QID) | ORAL | Status: DC | PRN

## 2015-01-13 MED ORDER — TRAMADOL HCL 50 MG PO TABS: 50.0000 mg | ORAL_TABLET | Freq: Four times a day (QID) | ORAL | Status: DC | PRN

## 2015-01-13 MED ORDER — OXYCODONE-ACETAMINOPHEN 5-325 MG PO TABS
1.0000 | ORAL_TABLET | Freq: Once | ORAL | Status: AC
  Administered 2015-01-13: 1 via ORAL
  Filled 2015-01-13: qty 1

## 2015-01-13 NOTE — Discharge Instructions (Signed)
Do not take the narcotic if driving as it will make you sleepy. Follow up with Dr. Eulah Pont for continued back pain.

## 2015-01-13 NOTE — ED Provider Notes (Signed)
CSN: [id number]     Arrival date & time 01/13/15  1319 History  By signing my name below, I, Thomas [name], attest that this documentation has been prepared under the direction and in the presence of Kerrie Buffalo, NP Electronically Signed: Jarvis [name], ED Scribe. 01/13/2015. 3:33 PM.    Chief Complaint  Patient presents with  . Back Pain     Patient is a 34 y.o. male presenting with back pain. The history is provided by the patient. No language interpreter was used.  Back Pain Location:  Lumbar spine Radiates to:  Does not radiate Pain severity:  Moderate Pain is:  Same all the time Onset quality:  Gradual Duration:  3 weeks Timing:  Constant Progression:  Waxing and waning Chronicity:  Recurrent Context: lifting heavy objects   Relieved by:  Nothing Worsened by:  Nothing tried Ineffective treatments:  Muscle relaxants and NSAIDs (Ibuprofen and Flexeril) Associated symptoms: no bladder incontinence, no bowel incontinence, no dysuria, no fever, no numbness, no paresthesias, no perianal numbness, no tingling and no weakness     HPI Comments: Thomas [name] is a 34 y.o. male who presents to the Emergency Department complaining of  constant, moderate, "9/10", non radiating, low back pain onset 3 weeks. He was seen in the ER 10 days ago for the same. 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 Pt was given Ibuprofen and Flexeril which he has been taking with no relief. He notes he has had back pain in the past and was told he strained his back. He feels like he needs Percocet for his pain.  He denies any known trauma or injury. Pt denies any f/u with orthopedist in the past for his pain. He denies any recent fever, chills, cough, congestion, urinary symptoms, urinary/bowel incontinence, numbness or tingling in lower extremities. He has no known medication allergies.    Past Medical History  Diagnosis Date  . Allergy   . Asthma    Past Surgical History   Procedure Laterality Date  . Hand surgery Right    Family History  Problem Relation Age of Onset  . Diabetes Mother    Social History  Substance Use Topics  . Smoking status: Current Every Day Smoker -- 1.00 packs/day    Types: Cigarettes  . Smokeless tobacco: Never Used  . Alcohol Use: No    Review of Systems  Constitutional: Negative for fever and chills.  Gastrointestinal: Negative for bowel incontinence.  Genitourinary: Negative for bladder incontinence, dysuria, urgency, frequency, hematuria and difficulty urinating.  Musculoskeletal: Positive for back pain.  Neurological: Negative for tingling, weakness, numbness and paresthesias.  All other systems reviewed and are negative.     Allergies  Review of patient's allergies indicates no known allergies.  Home Medications   Prior to Admission medications   Medication Sig Start Date End Date Taking? Authorizing Provider  cyclobenzaprine (FLEXERIL) 10 MG tablet Take 1 tablet (10 mg total) by mouth 2 (two) times daily as needed for muscle spasms. 01/04/15   Teressa Lower, NP  ibuprofen (ADVIL,MOTRIN) 800 MG tablet Take 1 tablet (800 mg total) by mouth 3 (three) times daily. 01/04/15   Teressa Lower, NP  traMADol (ULTRAM) 50 MG tablet Take 1 tablet (50 mg total) by mouth every 6 (six) hours as needed. 01/13/15   Hiyab Nhem Orlene Och, NP   Triage Vitals: BP 151/93 mmHg  Pulse 99  Temp(Src) 98.3 F (36.8 C)  Resp 18  SpO2 99%  Physical Exam  Constitutional: He is oriented to person, place, and time. He appears well-developed and well-nourished. No distress.  HENT:  Head: Normocephalic and atraumatic.  Eyes: EOM are normal.  Neck: Normal range of motion. Neck supple.  Cardiovascular: Normal rate, regular rhythm and normal pulses.   Pulses:      Radial pulses are 2+ on the right side, and 2+ on the left side.       Dorsalis pedis pulses are 2+ on the right side, and 2+ on the left side.  Adequate circulation   Pulmonary/Chest: Effort normal and breath sounds normal. No respiratory distress.  Abdominal: Soft. There is no tenderness. There is no CVA tenderness.  Musculoskeletal: Normal range of motion. He exhibits no edema.       Lumbar back: He exhibits tenderness. He exhibits normal range of motion, no deformity, no spasm and normal pulse.  No spinal tenderness.  TTP of bilateral lower lumbar muscle area  Neurological: He is alert and oriented to person, place, and time. He has normal strength. No cranial nerve deficit or sensory deficit. Coordination and gait normal.  Reflex Scores:      Bicep reflexes are 2+ on the right side and 2+ on the left side.      Brachioradialis reflexes are 2+ on the right side and 2+ on the left side.      Patellar reflexes are 2+ on the right side and 2+ on the left side.      Achilles reflexes are 2+ on the right side and 2+ on the left side. Steady gait w/o foot drag SLR w/o difficulty Strength equal bilaterally in lower extremities  Skin: Skin is warm and dry.  Psychiatric: He has a normal mood and affect. His behavior is normal.  Nursing note and vitals reviewed.   ED Course  Procedures (including critical care time)  DIAGNOSTIC STUDIES: Oxygen Saturation is 99% on RA, normal by my interpretation.    COORDINATION OF CARE: 3:26 PM- Will order pt  Percocet to take in ER along with Toradol. Will order pt rx for Ultram. Pt advised of plan for treatment and pt agrees.    MDM  34 y.o. male with hx of low back pain and similar episode that started last week. Stable for d/c without red flags to indicate any need for immediate neuro consult. Neurologically intact. Will give patient one Percocet her today and Toradol 30 mg IM and Rx for Tramadol and referral to ortho.   Final diagnoses:  Lumbosacral strain, subsequent encounter   I personally performed the services described in this documentation, which was scribed in my presence. The recorded  information has been reviewed and is accurate.    [address] [place], NP 01/13/15 1538  Mirian Mo, MD 01/18/15 1754

## 2015-01-13 NOTE — ED Notes (Signed)
Pt sts lower back pain x 3 weeks; pt seen here for same last week but sts meds are not helping

## 2015-01-13 NOTE — ED Notes (Signed)
PT refuses to leave until he gets a Advertising account executive for Percocet . Pt requesting to speak to PA.

## 2015-09-03 ENCOUNTER — Emergency Department (HOSPITAL_COMMUNITY)
Admission: EM | Admit: 2015-09-03 | Discharge: 2015-09-03 | Disposition: A | Discharge status: Home / Self Care | Payer: Self-pay | Attending: Emergency Medicine | Admitting: Emergency Medicine

## 2015-09-03 ENCOUNTER — Emergency Department (HOSPITAL_COMMUNITY): Payer: Self-pay

## 2015-09-03 ENCOUNTER — Encounter (HOSPITAL_COMMUNITY): Payer: Self-pay

## 2015-09-03 DIAGNOSIS — S92301A Fracture of unspecified metatarsal bone(s), right foot, initial encounter for closed fracture: Secondary | ICD-10-CM

## 2015-09-03 DIAGNOSIS — Y939 Activity, unspecified: Secondary | ICD-10-CM | POA: Insufficient documentation

## 2015-09-03 DIAGNOSIS — F17210 Nicotine dependence, cigarettes, uncomplicated: Secondary | ICD-10-CM | POA: Insufficient documentation

## 2015-09-03 DIAGNOSIS — J45909 Unspecified asthma, uncomplicated: Secondary | ICD-10-CM | POA: Insufficient documentation

## 2015-09-03 DIAGNOSIS — Z79899 Other long term (current) drug therapy: Secondary | ICD-10-CM | POA: Insufficient documentation

## 2015-09-03 DIAGNOSIS — Y929 Unspecified place or not applicable: Secondary | ICD-10-CM | POA: Insufficient documentation

## 2015-09-03 DIAGNOSIS — X500XXA Overexertion from strenuous movement or load, initial encounter: Secondary | ICD-10-CM | POA: Insufficient documentation

## 2015-09-03 DIAGNOSIS — S92351A Displaced fracture of fifth metatarsal bone, right foot, initial encounter for closed fracture: Secondary | ICD-10-CM | POA: Insufficient documentation

## 2015-09-03 DIAGNOSIS — Y999 Unspecified external cause status: Secondary | ICD-10-CM | POA: Insufficient documentation

## 2015-09-03 MED ORDER — HYDROCODONE-ACETAMINOPHEN 5-325 MG PO TABS
1.0000 | ORAL_TABLET | Freq: Once | ORAL | Status: AC
  Administered 2015-09-03: 1 via ORAL
  Filled 2015-09-03: qty 1

## 2015-09-03 MED ORDER — HYDROCODONE-ACETAMINOPHEN 5-325 MG PO TABS: 1.0000 | ORAL_TABLET | Freq: Four times a day (QID) | ORAL | Status: DC | PRN

## 2015-09-03 NOTE — ED Notes (Signed)
Patient here with right foot pain and swelling after rolling same while moving piano yesterday. Also complains of lumbar back pain

## 2015-09-03 NOTE — Progress Notes (Signed)
Orthopedic Tech Progress Note Patient Details:  Thomas [name] [birth year] [id number]  Ortho Devices Type of Ortho Device: Ace wrap, Post (short leg) splint Ortho Device/Splint Location: RLE Ortho Device/Splint Interventions: Ordered, Application   Jennye MoccasinHughes, Krystian Younglove [name] 09/03/2015, 4:48 PM

## 2015-09-03 NOTE — ED Provider Notes (Signed)
CSN: [id number]     Arrival date & time 09/03/15  1436 History  By signing my name below, I, Thomas [name], attest that this documentation has been prepared under the direction and in the presence of BoeingChris Kirubel Aja, PA-C.   Electronically Signed: Iona Beardhristian [name], ED Scribe 09/03/2015 at 3:38 PM.  Chief Complaint  Patient presents with  . Foot Injury  . Back Pain   The history is provided by the patient. No language interpreter was used.   HPI Comments: Thomas [name] is a 35 y.o. male who presents to the Emergency Department complaining of sudden onset, right foot pain, beginning two days ago when he rolled the foot while carrying a piano. Pt reports associated swelling to the right foot. No other associated symptoms noted. No worsening or alleviating factors noted. Pt denies numbness, tingling, weakness, or any other pertinent symptoms.   Past Medical History  Diagnosis Date  . Allergy   . Asthma    Past Surgical History  Procedure Laterality Date  . Hand surgery Right    Family History  Problem Relation Age of Onset  . Diabetes Mother    Social History  Substance Use Topics  . Smoking status: Current Every Day Smoker -- 1.00 packs/day    Types: Cigarettes  . Smokeless tobacco: Never Used  . Alcohol Use: No    Review of Systems A complete 10 system review of systems was obtained and all systems are negative except as noted in the HPI and PMH.   Allergies  Review of patient's allergies indicates no known allergies.  Home Medications   Prior to Admission medications   Medication Sig Start Date End Date Taking? Authorizing Provider  cyclobenzaprine (FLEXERIL) 10 MG tablet Take 1 tablet (10 mg total) by mouth 2 (two) times daily as needed for muscle spasms. 01/04/15   Teressa LowerVrinda Pickering, NP  HYDROcodone-acetaminophen (NORCO) 5-325 MG tablet Take 1 tablet by mouth every 6 (six) hours as needed. 01/13/15   Hope Orlene OchM Neese, NP  ibuprofen (ADVIL,MOTRIN) 800 MG tablet Take 1  tablet (800 mg total) by mouth 3 (three) times daily. 01/04/15   Teressa LowerVrinda Pickering, NP   BP 145/81 mmHg  Pulse 99  Temp(Src) 97.7 F (36.5 C) (Oral)  Resp 18  SpO2 97% Physical Exam  Constitutional: He is oriented to person, place, and time. He appears well-developed and well-nourished.  HENT:  Head: Normocephalic and atraumatic.  Eyes: EOM are normal.  Neck: Normal range of motion.  Cardiovascular: Normal rate, regular rhythm, normal heart sounds and intact distal pulses.   Pulmonary/Chest: Effort normal and breath sounds normal. No respiratory distress.  Abdominal: Soft. He exhibits no distension. There is no tenderness.  Musculoskeletal: Normal range of motion. He exhibits tenderness.       Right foot: There is tenderness and swelling.  Pain and swelling over lateral aspect of right foot. No wounds noted.   Neurological: He is alert and oriented to person, place, and time.  Skin: Skin is warm and dry.  Psychiatric: He has a normal mood and affect. Judgment normal.  Nursing note and vitals reviewed.   ED Course  Procedures (including critical care time) DIAGNOSTIC STUDIES: Oxygen Saturation is 97% on RA, normal by my interpretation.    COORDINATION OF CARE: 3:38 PM Discussed treatment plan with pt at bedside and pt agreed to plan.  Labs Review Labs Reviewed - No data to display  Imaging Review Dg Foot Complete Right  09/03/2015  CLINICAL DATA:  Crush injury to  foot while moving can of, initial encounter EXAM: RIGHT FOOT COMPLETE - 3+ VIEW COMPARISON:  None. FINDINGS: Transverse fracture through the base of the fifth metatarsal is noted with mild displacement. Mild soft tissue swelling is seen. No other fracture is noted. IMPRESSION: Fifth metatarsal fracture. Electronically Signed   By: MarkAlcide CleverM.D.   On: 09/03/2015 15:19   I have personally reviewed and evaluated these images and lab results as part of my medical decision-making.  I personally performed the services  described in this documentation, which was scribed in my presence. The recorded information has been reviewed and is accurate.   Charlestine Night, PA-C 09/07/15 0640  Laurence Spates, MD 09/08/15 [phone]

## 2015-09-03 NOTE — Discharge Instructions (Signed)
Return here as needed. Ice and elevate your foot. Follow up with the orthopedist provided.

## 2015-09-03 NOTE — ED Notes (Signed)
EDP at bedside  

## 2015-09-14 ENCOUNTER — Encounter (HOSPITAL_BASED_OUTPATIENT_CLINIC_OR_DEPARTMENT_OTHER): Payer: Self-pay | Admitting: *Deleted

## 2015-09-14 ENCOUNTER — Emergency Department (HOSPITAL_BASED_OUTPATIENT_CLINIC_OR_DEPARTMENT_OTHER)
Admission: EM | Admit: 2015-09-14 | Discharge: 2015-09-14 | Disposition: A | Discharge status: Home / Self Care | Payer: MEDICAID | Attending: Emergency Medicine | Admitting: Emergency Medicine

## 2015-09-14 DIAGNOSIS — Y929 Unspecified place or not applicable: Secondary | ICD-10-CM | POA: Insufficient documentation

## 2015-09-14 DIAGNOSIS — W208XXA Other cause of strike by thrown, projected or falling object, initial encounter: Secondary | ICD-10-CM | POA: Insufficient documentation

## 2015-09-14 DIAGNOSIS — J45909 Unspecified asthma, uncomplicated: Secondary | ICD-10-CM | POA: Insufficient documentation

## 2015-09-14 DIAGNOSIS — F17210 Nicotine dependence, cigarettes, uncomplicated: Secondary | ICD-10-CM | POA: Insufficient documentation

## 2015-09-14 DIAGNOSIS — Y999 Unspecified external cause status: Secondary | ICD-10-CM | POA: Insufficient documentation

## 2015-09-14 DIAGNOSIS — Y939 Activity, unspecified: Secondary | ICD-10-CM | POA: Insufficient documentation

## 2015-09-14 DIAGNOSIS — M79671 Pain in right foot: Secondary | ICD-10-CM | POA: Insufficient documentation

## 2015-09-14 MED ORDER — HYDROCODONE-ACETAMINOPHEN 5-325 MG PO TABS: 1.0000 | ORAL_TABLET | Freq: Four times a day (QID) | ORAL | Status: DC | PRN

## 2015-09-14 MED ORDER — IBUPROFEN 800 MG PO TABS
800.0000 mg | ORAL_TABLET | Freq: Three times a day (TID) | ORAL | Status: DC
Start: 2015-09-14 — End: 2015-12-21

## 2015-09-14 NOTE — ED Notes (Signed)
CMS intact before and after. Pt tolerated well. Pt had no questions.  

## 2015-09-14 NOTE — Discharge Instructions (Signed)
Cast or Splint Care °Casts and splints support injured limbs and keep bones from moving while they heal. It is important to care for your cast or splint at home.   °HOME CARE INSTRUCTIONS °· Keep the cast or splint uncovered during the drying period. It can take 24 to 48 hours to dry if it is made of plaster. A fiberglass cast will dry in less than 1 hour. °· Do not rest the cast on anything harder than a pillow for the first 24 hours. °· Do not put weight on your injured limb or apply pressure to the cast until your health care provider gives you permission. °· Keep the cast or splint dry. Wet casts or splints can lose their shape and may not support the limb as well. A wet cast that has lost its shape can also create harmful pressure on your skin when it dries. Also, wet skin can become infected. °· Cover the cast or splint with a plastic bag when bathing or when out in the rain or snow. If the cast is on the trunk of the body, take sponge baths until the cast is removed. °· If your cast does become wet, dry it with a towel or a blow dryer on the cool setting only. °· Keep your cast or splint clean. Soiled casts may be wiped with a moistened cloth. °· Do not place any hard or soft foreign objects under your cast or splint, such as cotton, toilet paper, lotion, or powder. °· Do not try to scratch the skin under the cast with any object. The object could get stuck inside the cast. Also, scratching could lead to an infection. If itching is a problem, use a blow dryer on a cool setting to relieve discomfort. °· Do not trim or cut your cast or remove padding from inside of it. °· Exercise all joints next to the injury that are not immobilized by the cast or splint. For example, if you have a long leg cast, exercise the hip joint and toes. If you have an arm cast or splint, exercise the shoulder, elbow, thumb, and fingers. °· Elevate your injured arm or leg on 1 or 2 pillows for the first 1 to 3 days to decrease  swelling and pain. It is best if you can comfortably elevate your cast so it is higher than your heart. °SEEK MEDICAL CARE IF:  °· Your cast or splint cracks. °· Your cast or splint is too tight or too loose. °· You have unbearable itching inside the cast. °· Your cast becomes wet or develops a soft spot or area. °· You have a bad smell coming from inside your cast. °· You get an object stuck under your cast. °· Your skin around the cast becomes red or raw. °· You have new pain or worsening pain after the cast has been applied. °SEEK IMMEDIATE MEDICAL CARE IF:  °· You have fluid leaking through the cast. °· You are unable to move your fingers or toes. °· You have discolored (blue or white), cool, painful, or very swollen fingers or toes beyond the cast. °· You have tingling or numbness around the injured area. °· You have severe pain or pressure under the cast. °· You have any difficulty with your breathing or have shortness of breath. °· You have chest pain. °  °This information is not intended to replace advice given to you by your health care provider. Make sure you discuss any questions you have with your health care   provider. °  °Document Released: 03/19/2000 Document Revised: 01/10/2013 Document Reviewed: 09/28/2012 °Elsevier Interactive Patient Education ©2016 [employer] ° °Metatarsal Fracture °A metatarsal fracture is a break in a metatarsal bone. Metatarsal bones connect your toe bones to your ankle bones. °CAUSES °This type of fracture may be caused by: °· A sudden twisting of your foot. °· A fall onto your foot. °· Overuse or repetitive exercise. °RISK FACTORS °This condition is more likely to develop in people who: °· Play contact sports. °· Have a bone disease. °· Have a low calcium level. °SYMPTOMS °Symptoms of this condition include: °· Pain that is worse when walking or standing. °· Pain when pressing on the foot or moving the toes. °· Swelling. °· Bruising on the top or bottom of the foot. °· A  foot that appears shorter than the other one. °DIAGNOSIS °This condition is diagnosed with a physical exam. You may also have imaging tests, such as: °· X-rays. °· A CT scan. °· MRI. °TREATMENT °Treatment for this condition depends on its severity and whether a bone has moved out of place. Treatment may involve: °· Rest. °· Wearing foot support such as a cast, splint, or boot for several weeks. °· Using crutches. °· Surgery to move bones back into the right position. Surgery is usually needed if there are many pieces of broken bone or bones that are very out of place (displaced fracture). °· Physical therapy. This may be needed to help you regain full movement and strength in your foot. °You will need to return to your health care provider to have X-rays taken until your bones heal. Your health care provider will look at the X-rays to make sure that your foot is healing well. °HOME CARE INSTRUCTIONS  °If You Have a Cast: °· Do not stick anything inside the cast to scratch your skin. Doing that increases your risk of infection. °· Check the skin around the cast every day. Report any concerns to your health care provider. You may put lotion on dry skin around the edges of the cast. Do not apply lotion to the skin underneath the cast. °· Keep the cast clean and dry. °If You Have a Splint or a Supportive Boot: °· Wear it as directed by your health care provider. Remove it only as directed by your health care provider. °· Loosen it if your toes become numb and tingle, or if they turn cold and blue. °· Keep it clean and dry. °Bathing °· Do not take baths, swim, or use a hot tub until your health care provider approves. Ask your health care provider if you can take showers. You may only be allowed to take sponge baths for bathing. °· If your health care provider approves bathing and showering, cover the cast or splint with a watertight plastic bag to protect it from water. Do not let the cast or splint get wet. °Managing  Pain, Stiffness, and Swelling °· If directed, apply ice to the injured area (if you have a splint, not a cast). °¨ Put ice in a plastic bag. °¨ Place a towel between your skin and the bag. °¨ Leave the ice on for 20 minutes, 2-3 times per day. °· Move your toes often to avoid stiffness and to lessen swelling. °· Raise (elevate) the injured area above the level of your heart while you are sitting or lying down. °Driving °· Do not drive or operate heavy machinery while taking pain medicine. °· Do not drive while wearing foot support   on a foot that you use for driving. °Activity °· Return to your normal activities as directed by your health care provider. Ask your health care provider what activities are safe for you. °· Perform exercises as directed by your health care provider or physical therapist. °Safety °· Do not use the injured foot to support your body weight until your health care provider says that you can. Use crutches as directed by your health care provider. °General Instructions °· Do not put pressure on any part of the cast or splint until it is fully hardened. This may take several hours. °· Do not use any tobacco products, including cigarettes, chewing tobacco, or e-cigarettes. Tobacco can delay bone healing. If you need help quitting, ask your health care provider. °· Take medicines only as directed by your health care provider. °· Keep all follow-up visits as directed by your health care provider. This is important. °SEEK MEDICAL CARE IF: °· You have a fever. °· Your cast, splint, or boot is too loose or too tight. °· Your cast, splint, or boot is damaged. °· Your pain medicine is not helping. °· You have pain, tingling, or numbness in your foot that is not going away. °SEEK IMMEDIATE MEDICAL CARE IF: °· You have severe pain. °· You have tingling or numbness in your foot that is getting worse. °· Your foot feels cold or becomes numb. °· Your foot changes color. °  °This information is not intended to  replace advice given to you by your health care provider. Make sure you discuss any questions you have with your health care provider. °  °Document Released: 12/12/2001 Document Revised: 08/06/2014 Document Reviewed: 01/16/2014 °Elsevier Interactive Patient Education ©2016 [employer] Inc. ° °

## 2015-09-14 NOTE — ED Provider Notes (Signed)
CSN: [id number]     Arrival date & time 09/14/15  2036 History  By signing my name below, I, Thomas [name], attest that this documentation has been prepared under the direction and in the presence of Thomas Fossa, MD. Electronically Signed: Soijett [name], ED Scribe. 09/14/2015. 10:37 PM.   Chief Complaint  Patient presents with  . Foot Injury      The history is provided by the patient. No language interpreter was used.    Thomas [name] is a 35 y.o. male who presents to the Emergency Department complaining of right foot injury occuring 1.5 weeks ago. Pt reports that he injured his right foot while moving a piano down a flight of stairs and he was seen in the ED for evaluation of his right foot injury and informed that he fractured his foot. Pt was referred to an orthopedist, which he called 2 days ago for follow up and hasn't heard back for an appointment yet. Pt states that his pain is still the same as when he was seen in the ED. Pt is having associated symptoms of right foot pain. He notes that he has tried Rx norco for the relief of his symptoms. He denies any other symptoms.   Per pt chart review: Pt was seen in the ED on 09/03/2015 for right foot injury. Pt had right foot imaging completed with results of fifth metatarsal fracture. Pt was referred to orthopedist and Rx norco for their symptoms.   Past Medical History  Diagnosis Date  . Allergy   . Asthma    Past Surgical History  Procedure Laterality Date  . Hand surgery Right    Family History  Problem Relation Age of Onset  . Diabetes Mother    Social History  Substance Use Topics  . Smoking status: Current Every Day Smoker -- 1.00 packs/day    Types: Cigarettes  . Smokeless tobacco: Never Used  . Alcohol Use: No    Review of Systems  Constitutional: Negative for fever.  Musculoskeletal: Positive for arthralgias. Negative for gait problem.  All other systems reviewed and are negative.     Allergies  Review  of patient's allergies indicates no known allergies.  Home Medications   Prior to Admission medications   Medication Sig Start Date End Date Taking? Authorizing Provider  cyclobenzaprine (FLEXERIL) 10 MG tablet Take 1 tablet (10 mg total) by mouth 2 (two) times daily as needed for muscle spasms. 01/04/15   Teressa Lower, NP  HYDROcodone-acetaminophen (NORCO/VICODIN) 5-325 MG tablet Take 1 tablet by mouth every 6 (six) hours as needed for moderate pain. 09/14/15   Thomas Fossa, MD  ibuprofen (ADVIL,MOTRIN) 800 MG tablet Take 1 tablet (800 mg total) by mouth 3 (three) times daily. 09/14/15   Thomas Fossa, MD   BP 135/80 mmHg  Pulse 58  Temp(Src) 98.6 F (37 C) (Oral)  Resp 20  Ht  (1.88 m)  Wt 180 lb (81.647 kg)  BMI 23.10 kg/m2  SpO2 98% Physical Exam  Constitutional: He is oriented to person, place, and time. He appears well-developed and well-nourished.  HENT:  Head: Normocephalic and atraumatic.  Cardiovascular: Normal rate.   Pulmonary/Chest: Effort normal. No respiratory distress.  Neurological: He is alert and oriented to person, place, and time.  Skin: Skin is warm and dry.  Right foot in a posterior splint, toes warm and well perfused. Sensation intact to light touch throughout the digits.   Psychiatric: He has a normal mood and affect. His behavior is  normal.  Nursing note and vitals reviewed.   ED Course  Procedures  DIAGNOSTIC STUDIES: Oxygen Saturation is 99% on RA, nl by my interpretation.    COORDINATION OF CARE: 10:37 PM Discussed treatment plan with pt at bedside which includes crutches, follow up with orthopedist, pain medication Rx, and pt agreed to plan.    Labs Review Labs Reviewed - No data to display  Imaging Review No results found.   EKG Interpretation None      MDM   Final diagnoses:  Right foot pain   Pt here for ongoing right foot pain following a foot injury two weeks ago.  He is in a posterior splint with good perfusion.   He denies walking on the splint or new injuries.  He only started to contact Ortho at the end of last week for follow up.  Discussed with patient that he will need to try again Monday morning.  Discussed elevating the foot, ortho follow up, return precautions.    I personally performed the services described in this documentation, which was scribed in my presence. The recorded information has been reviewed and is accurate.    Thomas FossaElizabeth Marlea Gambill, MD 09/15/15 1209

## 2015-09-14 NOTE — ED Notes (Signed)
Pt given d/c instructions as per chart. Rx x 2 with narcotic and tylenol precautions. Verbalizes understanding. No questions. 

## 2015-09-14 NOTE — ED Notes (Signed)
Pt reports right broken foot that is hurting.  Reports that he called his orthopedic doctor on Friday and has not received a call back. Sensation, CMS intact, Cap refill <2, pt is able to move toes without difficulty.

## 2015-12-21 ENCOUNTER — Encounter (HOSPITAL_COMMUNITY): Payer: Self-pay

## 2015-12-21 ENCOUNTER — Emergency Department (HOSPITAL_COMMUNITY)
Admission: EM | Admit: 2015-12-21 | Discharge: 2015-12-21 | Disposition: A | Discharge status: Home / Self Care | Payer: Self-pay | Attending: Emergency Medicine | Admitting: Emergency Medicine

## 2015-12-21 DIAGNOSIS — Y939 Activity, unspecified: Secondary | ICD-10-CM | POA: Insufficient documentation

## 2015-12-21 DIAGNOSIS — Y999 Unspecified external cause status: Secondary | ICD-10-CM | POA: Insufficient documentation

## 2015-12-21 DIAGNOSIS — Y92410 Unspecified street and highway as the place of occurrence of the external cause: Secondary | ICD-10-CM | POA: Insufficient documentation

## 2015-12-21 DIAGNOSIS — S61012A Laceration without foreign body of left thumb without damage to nail, initial encounter: Secondary | ICD-10-CM | POA: Insufficient documentation

## 2015-12-21 DIAGNOSIS — M545 Low back pain: Secondary | ICD-10-CM | POA: Insufficient documentation

## 2015-12-21 DIAGNOSIS — M79671 Pain in right foot: Secondary | ICD-10-CM | POA: Insufficient documentation

## 2015-12-21 DIAGNOSIS — F17210 Nicotine dependence, cigarettes, uncomplicated: Secondary | ICD-10-CM | POA: Insufficient documentation

## 2015-12-21 DIAGNOSIS — J45909 Unspecified asthma, uncomplicated: Secondary | ICD-10-CM | POA: Insufficient documentation

## 2015-12-21 MED ORDER — MELOXICAM 7.5 MG PO TABS: 7.5000 mg | ORAL_TABLET | Freq: Every day | ORAL | 0 refills | Status: DC

## 2015-12-21 MED ORDER — METHOCARBAMOL 500 MG PO TABS: 500.0000 mg | ORAL_TABLET | Freq: Four times a day (QID) | ORAL | 0 refills | Status: DC | PRN
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
## 2015-12-21 NOTE — Discharge Instructions (Signed)
Read the information below.  Use the prescribed medication as directed.  Please discuss all new medications with your pharmacist.  You may return to the Emergency Department at any time for worsening condition or any new symptoms that concern you.    °
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
## 2015-12-21 NOTE — ED Notes (Signed)
Declined W/C at D/C and was escorted to lobby by RN. 

## 2015-12-24 ENCOUNTER — Emergency Department (HOSPITAL_BASED_OUTPATIENT_CLINIC_OR_DEPARTMENT_OTHER): Payer: Self-pay

## 2015-12-24 ENCOUNTER — Emergency Department (HOSPITAL_BASED_OUTPATIENT_CLINIC_OR_DEPARTMENT_OTHER)
Admission: EM | Admit: 2015-12-24 | Discharge: 2015-12-24 | Disposition: A | Discharge status: Home / Self Care | Payer: Self-pay | Attending: Emergency Medicine | Admitting: Emergency Medicine

## 2015-12-24 ENCOUNTER — Encounter (HOSPITAL_BASED_OUTPATIENT_CLINIC_OR_DEPARTMENT_OTHER): Payer: Self-pay

## 2015-12-24 DIAGNOSIS — M25512 Pain in left shoulder: Secondary | ICD-10-CM | POA: Insufficient documentation

## 2015-12-24 DIAGNOSIS — Y999 Unspecified external cause status: Secondary | ICD-10-CM | POA: Insufficient documentation

## 2015-12-24 DIAGNOSIS — Y92410 Unspecified street and highway as the place of occurrence of the external cause: Secondary | ICD-10-CM | POA: Insufficient documentation

## 2015-12-24 DIAGNOSIS — J45909 Unspecified asthma, uncomplicated: Secondary | ICD-10-CM | POA: Insufficient documentation

## 2015-12-24 DIAGNOSIS — S161XXD Strain of muscle, fascia and tendon at neck level, subsequent encounter: Secondary | ICD-10-CM | POA: Insufficient documentation

## 2015-12-24 DIAGNOSIS — F17210 Nicotine dependence, cigarettes, uncomplicated: Secondary | ICD-10-CM | POA: Insufficient documentation

## 2015-12-24 DIAGNOSIS — Y9389 Activity, other specified: Secondary | ICD-10-CM | POA: Insufficient documentation

## 2015-12-24 DIAGNOSIS — M545 Low back pain: Secondary | ICD-10-CM | POA: Insufficient documentation

## 2015-12-24 MED ORDER — OXYCODONE-ACETAMINOPHEN 5-325 MG PO TABS
1.0000 | ORAL_TABLET | Freq: Once | ORAL | Status: AC
  Administered 2015-12-24: 1 via ORAL
  Filled 2015-12-24: qty 1

## 2015-12-24 MED ORDER — OXYCODONE-ACETAMINOPHEN 5-325 MG PO TABS: 2.0000 | ORAL_TABLET | ORAL | 0 refills | Status: DC | PRN
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
## 2015-12-24 NOTE — ED Notes (Signed)
Patient transported to X-ray 
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
## 2015-12-24 NOTE — Discharge Instructions (Signed)

## 2016-01-05 ENCOUNTER — Emergency Department (HOSPITAL_COMMUNITY)
Admission: EM | Admit: 2016-01-05 | Discharge: 2016-01-05 | Disposition: A | Discharge status: Home / Self Care | Payer: Self-pay | Attending: Emergency Medicine | Admitting: Emergency Medicine

## 2016-01-05 ENCOUNTER — Emergency Department (HOSPITAL_COMMUNITY): Payer: Self-pay

## 2016-01-05 ENCOUNTER — Encounter (HOSPITAL_COMMUNITY): Payer: Self-pay

## 2016-01-05 DIAGNOSIS — F17210 Nicotine dependence, cigarettes, uncomplicated: Secondary | ICD-10-CM | POA: Insufficient documentation

## 2016-01-05 DIAGNOSIS — J069 Acute upper respiratory infection, unspecified: Secondary | ICD-10-CM | POA: Insufficient documentation

## 2016-01-05 DIAGNOSIS — Y939 Activity, unspecified: Secondary | ICD-10-CM | POA: Insufficient documentation

## 2016-01-05 DIAGNOSIS — M545 Low back pain, unspecified: Secondary | ICD-10-CM

## 2016-01-05 DIAGNOSIS — S3992XA Unspecified injury of lower back, initial encounter: Secondary | ICD-10-CM | POA: Insufficient documentation

## 2016-01-05 DIAGNOSIS — Y92410 Unspecified street and highway as the place of occurrence of the external cause: Secondary | ICD-10-CM | POA: Insufficient documentation

## 2016-01-05 DIAGNOSIS — Y999 Unspecified external cause status: Secondary | ICD-10-CM | POA: Insufficient documentation

## 2016-01-05 DIAGNOSIS — J45909 Unspecified asthma, uncomplicated: Secondary | ICD-10-CM | POA: Insufficient documentation

## 2016-01-05 MED ORDER — NAPROXEN 375 MG PO TABS: 375.0000 mg | ORAL_TABLET | Freq: Two times a day (BID) | ORAL | 0 refills | Status: DC

## 2016-01-05 MED ORDER — BACLOFEN 10 MG PO TABS: 10.0000 mg | ORAL_TABLET | Freq: Three times a day (TID) | ORAL | 0 refills | Status: DC

## 2016-01-05 NOTE — Discharge Instructions (Signed)
All of your xrays were negative for acute injury or abnormality.  Take over the counter medication for your cold such as Nyquil, robitussin or any other cold medicine of your choice. I would also recommend purchasing lidocaine patches for your lower back.  Make sure to stretch your back and perform abdominal strengthening exercises. \You appear to have an upper respiratory infection (URI). An upper respiratory tract infection, or cold, is a viral infection of the air passages leading to the lungs. It is contagious and can be spread to others, especially during the first 3 or 4 days. It cannot be cured by antibiotics or other medicines. RETURN IMMEDIATELY IF you develop shortness of breath, confusion or altered mental status, a new rash, become dizzy, faint, or poorly responsive, or are unable to be cared for at home.  SEEK IMMEDIATE MEDICAL ATTENTION IF: New numbness, tingling, weakness, or problem with the use of your arms or legs.  Severe back pain not relieved with medications.  Change in bowel or bladder control.  Increasing pain in any areas of the body (such as chest or abdominal pain).  Shortness of breath, dizziness or fainting.  Nausea (feeling sick to your stomach), vomiting, fever, or sweats.
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
## 2016-01-05 NOTE — ED Notes (Signed)
Declined W/C at D/C and was escorted to lobby by RN. 

## 2016-01-13 ENCOUNTER — Emergency Department (HOSPITAL_BASED_OUTPATIENT_CLINIC_OR_DEPARTMENT_OTHER)
Admission: EM | Admit: 2016-01-13 | Discharge: 2016-01-13 | Disposition: A | Discharge status: Home / Self Care | Payer: Self-pay | Attending: Emergency Medicine | Admitting: Emergency Medicine

## 2016-01-13 ENCOUNTER — Encounter (HOSPITAL_BASED_OUTPATIENT_CLINIC_OR_DEPARTMENT_OTHER): Payer: Self-pay | Admitting: *Deleted

## 2016-01-13 ENCOUNTER — Emergency Department (HOSPITAL_BASED_OUTPATIENT_CLINIC_OR_DEPARTMENT_OTHER): Payer: Self-pay

## 2016-01-13 DIAGNOSIS — Y990 Civilian activity done for income or pay: Secondary | ICD-10-CM | POA: Insufficient documentation

## 2016-01-13 DIAGNOSIS — W28XXXA Contact with powered lawn mower, initial encounter: Secondary | ICD-10-CM | POA: Insufficient documentation

## 2016-01-13 DIAGNOSIS — J45909 Unspecified asthma, uncomplicated: Secondary | ICD-10-CM | POA: Insufficient documentation

## 2016-01-13 DIAGNOSIS — F17210 Nicotine dependence, cigarettes, uncomplicated: Secondary | ICD-10-CM | POA: Insufficient documentation

## 2016-01-13 DIAGNOSIS — Z79899 Other long term (current) drug therapy: Secondary | ICD-10-CM | POA: Insufficient documentation

## 2016-01-13 DIAGNOSIS — S52501A Unspecified fracture of the lower end of right radius, initial encounter for closed fracture: Secondary | ICD-10-CM | POA: Insufficient documentation

## 2016-01-13 DIAGNOSIS — Y929 Unspecified place or not applicable: Secondary | ICD-10-CM | POA: Insufficient documentation

## 2016-01-13 DIAGNOSIS — Y9389 Activity, other specified: Secondary | ICD-10-CM | POA: Insufficient documentation

## 2016-01-13 MED ORDER — HYDROCODONE-ACETAMINOPHEN 5-325 MG PO TABS: 1.0000 | ORAL_TABLET | Freq: Four times a day (QID) | ORAL | 0 refills | Status: DC | PRN

## 2016-01-13 NOTE — Discharge Instructions (Signed)
Wear splint as applied until followed up by orthopedics.  Follow-up with Dr. Janee Mornhompson in the orthopedic clinic. His contact information has been provided in this discharge summary for you to call and make these arrangements.  Ice for 20 minutes every 2 hours while awake for the next 2 days. Keep your arm elevated.  Hydrocodone as prescribed as needed for pain.

## 2016-01-13 NOTE — ED Provider Notes (Signed)
[facility]   CSN: [id number] Arrival date & time: 01/13/16  1646     History   Chief Complaint Chief Complaint  Patient presents with  . Hand Injury    HPI Thomas [name] is a 35 y.o. male.  Patient is a 35 year old male who presents for evaluation of a right wrist injury. He reports being thrown off of a 0 turn lawnmower. He, himself with his right hand and reports pain and swelling in his wrist. This occurred at work. He denies other injury. He denies any numbness or tingling. His pain is worse with movement and relieved with rest.   The history is provided by the patient.  Hand Injury   The incident occurred 1 to 2 hours ago. The incident occurred at work. The injury mechanism was a fall. The pain is present in the right wrist. The quality of the pain is described as throbbing. The pain is moderate. The pain has been constant since the incident. The symptoms are aggravated by movement. He has tried nothing for the symptoms.    Past Medical History:  Diagnosis Date  . Allergy   . Asthma     There are no active problems to display for this patient.   Past Surgical History:  Procedure Laterality Date  . HAND SURGERY Right        Home Medications    Prior to Admission medications   Medication Sig Start Date End Date Taking? Authorizing Provider  baclofen (LIORESAL) 10 MG tablet Take 1 tablet (10 mg total) by mouth 3 (three) times daily. 01/05/16   Arthor Captain, PA-C  naproxen (NAPROSYN) 375 MG tablet Take 1 tablet (375 mg total) by mouth 2 (two) times daily. 01/05/16   Arthor Captain, PA-C    Family History Family History  Problem Relation Age of Onset  . Diabetes Mother     Social History Social History  Substance Use Topics  . Smoking status: Current Every Day Smoker    Packs/day: 0.50    Types: Cigarettes  . Smokeless tobacco: Never Used  . Alcohol use No     Allergies   Review of patient's allergies indicates no  known allergies.   Review of Systems Review of Systems  All other systems reviewed and are negative.    Physical Exam Updated Vital Signs BP (!) 155/105   Pulse 63   Temp 98.6 F (37 C) (Oral)   Resp 20   Ht 6\' 2"  (1.88 m)   Wt 180 lb (81.6 kg)   SpO2 100%   BMI 23.11 kg/m   Physical Exam  Constitutional: He is oriented to person, place, and time. He appears well-developed and well-nourished.  HENT:  Head: Normocephalic and atraumatic.  Neck: Normal range of motion. Neck supple.  Musculoskeletal:  The right wrist is noted to have tenderness and swelling over the dorsal aspect of the distal radius. Is able to flex and extend his fingers. Sensation is intact. Capillary refill is brisk.  Neurological: He is alert and oriented to person, place, and time.  Skin: Skin is warm and dry.  Nursing note and vitals reviewed.    ED Treatments / Results  Labs (all labs ordered are listed, but only abnormal results are displayed) Labs Reviewed - No data to display  EKG  EKG Interpretation None       Radiology Dg Forearm Right  Result Date: 01/13/2016 CLINICAL DATA:  Larey Seat off lawnmower today.  Hand and forearm pain. EXAM: RIGHT  FOREARM - 2 VIEW; RIGHT HAND - COMPLETE 3+ VIEW COMPARISON:  None. FINDINGS: Right hand: The joint spaces are maintained. No acute fracture is identified. There is a remote healed fracture involving the fifth metacarpal with mild shortening and slight angulation at the metacarpal neck. I suspect there also prior fractures involving the bases of the fourth and fifth metacarpals. Faint lucency involving the articular aspect of the distal radius. Also, possible dorsal cortical fracture of the distal radius. Could not exclude subtle fractures. Does this patient have any wrist pain? Dedicated wrist films may be helpful. Right forearm: The elbow and wrist joints are maintained. Suspect a dorsal cortical fracture involving the distal radius. No forearm fractures  are identified. IMPRESSION: 1. Possible subtle distal radius fractures. Recommend correlation with clinical findings and dedicated wrist films. 2. Remote hand fractures.  No definite acute fracture. Electronically Signed   By: Rudie MeyerP.  Gallerani M.D.   On: 01/13/2016 17:27   Dg Hand Complete Right  Result Date: 01/13/2016 CLINICAL DATA:  Larey SeatFell off lawnmower today.  Hand and forearm pain. EXAM: RIGHT FOREARM - 2 VIEW; RIGHT HAND - COMPLETE 3+ VIEW COMPARISON:  None. FINDINGS: Right hand: The joint spaces are maintained. No acute fracture is identified. There is a remote healed fracture involving the fifth metacarpal with mild shortening and slight angulation at the metacarpal neck. I suspect there also prior fractures involving the bases of the fourth and fifth metacarpals. Faint lucency involving the articular aspect of the distal radius. Also, possible dorsal cortical fracture of the distal radius. Could not exclude subtle fractures. Does this patient have any wrist pain? Dedicated wrist films may be helpful. Right forearm: The elbow and wrist joints are maintained. Suspect a dorsal cortical fracture involving the distal radius. No forearm fractures are identified. IMPRESSION: 1. Possible subtle distal radius fractures. Recommend correlation with clinical findings and dedicated wrist films. 2. Remote hand fractures.  No definite acute fracture. Electronically Signed   By: Rudie MeyerP.  Gallerani M.D.   On: 01/13/2016 17:27    Procedures Procedures (including critical care time)  Medications Ordered in ED Medications - No data to display   Initial Impression / Assessment and Plan / ED Course  I have reviewed the triage vital signs and the nursing notes.  Pertinent labs & imaging results that were available during my care of the patient were reviewed by me and considered in my medical decision making (see chart for details).  Clinical Course    X-rays reveal a nondisplaced intra-articular fracture of the  distal right radius. This will be treated with a splint and follow-up with hand surgery for casting.  Final Clinical Impressions(s) / ED Diagnoses   Final diagnoses:  None    New Prescriptions New Prescriptions   No medications on file     Geoffery Lyonsouglas Cataleyah Colborn, MD 01/13/16 1738

## 2016-01-13 NOTE — ED Triage Notes (Signed)
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 Swelling and deformity to his right wrist and forearm.

## 2016-08-10 ENCOUNTER — Encounter (HOSPITAL_BASED_OUTPATIENT_CLINIC_OR_DEPARTMENT_OTHER): Payer: Self-pay | Admitting: Emergency Medicine

## 2016-08-10 ENCOUNTER — Emergency Department (HOSPITAL_BASED_OUTPATIENT_CLINIC_OR_DEPARTMENT_OTHER)
Admission: EM | Admit: 2016-08-10 | Discharge: 2016-08-10 | Disposition: A | Discharge status: Home / Self Care | Payer: Self-pay | Attending: Emergency Medicine | Admitting: Emergency Medicine

## 2016-08-10 DIAGNOSIS — J45909 Unspecified asthma, uncomplicated: Secondary | ICD-10-CM | POA: Insufficient documentation

## 2016-08-10 DIAGNOSIS — F17210 Nicotine dependence, cigarettes, uncomplicated: Secondary | ICD-10-CM | POA: Insufficient documentation

## 2016-08-10 DIAGNOSIS — Z79899 Other long term (current) drug therapy: Secondary | ICD-10-CM | POA: Insufficient documentation

## 2016-08-10 DIAGNOSIS — M545 Low back pain, unspecified: Secondary | ICD-10-CM

## 2016-08-10 MED ORDER — OXYCODONE-ACETAMINOPHEN 5-325 MG PO TABS: 1.0000 | ORAL_TABLET | Freq: Three times a day (TID) | ORAL | 0 refills | Status: DC | PRN

## 2016-08-10 MED ORDER — IBUPROFEN 800 MG PO TABS: 800.0000 mg | ORAL_TABLET | Freq: Three times a day (TID) | ORAL | 0 refills | Status: DC | PRN

## 2016-08-10 MED FILL — OXYCODONE/APAP 5/325 MG TAB: 5-325 | 4 days supply | Qty: 10 | Fill #0

## 2016-08-10 MED FILL — IBUPROFEN 800 MG TAB: 800 | 7 days supply | Qty: 21 | Fill #0

## 2016-08-10 NOTE — Discharge Instructions (Signed)
You have been seen in the Emergency Department (ED)  today for back pain.  Your workup and exam have not shown any acute abnormalities and you are likely suffering from muscle strain or possible problems with your discs, but there is no treatment that will fix your symptoms at this time.  Please take Motrin (ibuprofen) as needed for your pain according to the instructions written on the box.  Alternatively, for the next five days you can take 600mg three times daily with meals (it may upset your stomach). ° °Take Percocet as prescribed for severe pain. Do not drink alcohol, drive or participate in any other potentially dangerous activities while taking this medication as it may make you sleepy. Do not take this medication with any other sedating medications, either prescription or over-the-counter. If you were prescribed Percocet or Vicodin, do not take these with acetaminophen (Tylenol) as it is already contained within these medications. °  °This medication is an opiate (or narcotic) pain medication and can be habit forming.  Use it as little as possible to achieve adequate pain control.  Do not use or use it with extreme caution if you have a history of opiate abuse or dependence.  If you are on a pain contract with your primary care doctor or a pain specialist, be sure to let them know you were prescribed this medication today from the Emergency Department.  This medication is intended for your use only - do not give any to anyone else and keep it in a secure place where nobody else, especially children, have access to it.  It will also cause or worsen constipation, so you may want to consider taking an over-the-counter stool softener while you are taking this medication. ° °Please follow up with your doctor as soon as possible regarding today's ED visit and your back pain.  Return to the ED for worsening back pain, fever, weakness or numbness of either leg, or if you develop either (1) an inability to urinate  or have bowel movements, or (2) loss of your ability to control your bathroom functions (if you start having "accidents"), or if you develop other new symptoms that concern you. ° °

## 2016-08-10 NOTE — ED Provider Notes (Signed)
Emergency Department Provider Note   I have reviewed the triage vital signs and the nursing notes.   HISTORY  Chief Complaint Back Pain   HPI Thomas [name] is a 36 y.o. male with PMH of asthma and chronic back pain since the emergency department for evaluation of lower back pain. Pain is ongoing for 2 days. Patient describes it as bilateral lower back with no radiation to the legs. No numbness or weakness. No bowel or bladder incontinence. No groin numbness. Patient states he was lifting a heavy piece of furniture at work when his pain suddenly got worse. Seems similar to prior episodes. No difficulty walking.   Past Medical History:  Diagnosis Date  . Allergy   . Asthma     There are no active problems to display for this patient.   Past Surgical History:  Procedure Laterality Date  . HAND SURGERY Right     Current Outpatient Rx  . Order #: [id number] Class: Print  . Order #: [id number] Class: Print  . Order #: [id number] Class: Print  . Order #: [id number] Class: Print  . Order #: [id number] Class: Print    Allergies Patient has no known allergies.  Family History  Problem Relation Age of Onset  . Diabetes Mother     Social History Social History  Substance Use Topics  . Smoking status: Current Every Day Smoker    Packs/day: 0.50    Types: Cigarettes  . Smokeless tobacco: Never Used  . Alcohol use No    Review of Systems  Constitutional: No fever/chills Eyes: No visual changes. ENT: No sore throat. Cardiovascular: Denies chest pain. Respiratory: Denies shortness of breath. Gastrointestinal: No abdominal pain.  No nausea, no vomiting.  No diarrhea.  No constipation. Genitourinary: Negative for dysuria. Musculoskeletal: Positive for back pain. Skin: Negative for rash. Neurological: Negative for headaches, focal weakness or numbness.  10-point ROS otherwise negative.  ____________________________________________   PHYSICAL EXAM:  VITAL  SIGNS: ED Triage Vitals  Enc Vitals Group     BP 08/10/16 1417 (!) 139/106     Pulse Rate 08/10/16 1417 100     Resp 08/10/16 1417 18     Temp 08/10/16 1417 98.3 F (36.8 C)     Temp Source 08/10/16 1417 Oral     SpO2 08/10/16 1417 100 %     Weight 08/10/16 1418 180 lb (81.6 kg)     Height 08/10/16 1418 6\' 2"  (1.88 m)     Pain Score 08/10/16 1417 9   Constitutional: Alert and oriented. Well appearing and in no acute distress. Eyes: Conjunctivae are normal.  Head: Atraumatic. Nose: No congestion/rhinnorhea. Mouth/Throat: Mucous membranes are moist.  Oropharynx non-erythematous. Neck: No stridor.   Cardiovascular: Normal rate, regular rhythm. Good peripheral circulation. Grossly normal heart sounds.   Respiratory: Normal respiratory effort.  No retractions. Lungs CTAB. Gastrointestinal: Soft and nontender. No distention.  Musculoskeletal: No lower extremity tenderness nor edema. No gross deformities of extremities. Mild tenderness to palpation of the paraspinal muscles in the lumbar spine.  Neurologic:  Normal speech and language. No gross focal neurologic deficits are appreciated. 2+ patellar reflexes.  Skin:  Skin is warm, dry and intact. No rash noted. Psychiatric: Mood and affect are normal. Speech and behavior are normal.  ____________________________________________  RADIOLOGY  None ____________________________________________   PROCEDURES  Procedure(s) performed:   Procedures  None ____________________________________________   INITIAL IMPRESSION / ASSESSMENT AND PLAN / ED COURSE  Pertinent labs & imaging results that were available during my care of the  patient were reviewed by me and considered in my medical decision making (see chart for details).  Patient presents to the ED for evaluation of uncomplicated lower back pain. Appears MSK in origin. Has history of similar. No neuro deficits. Patient ambulatory with normal reflexes. No evidence to suggest  vascular etiology. Plan for pain medication for home and work note. Recommended primary NSAIDs and early mobility to improve symptoms. Will follow with PCP.   At this time, I do not feel there is any life-threatening condition present. I have reviewed and discussed all results (EKG, imaging, lab, urine as appropriate), exam findings with patient. I have reviewed nursing notes and appropriate previous records.  I feel the patient is safe to be discharged home without further emergent workup. Discussed usual and customary return precautions. Patient and family (if present) verbalize understanding and are comfortable with this plan.  Patient will follow-up with their primary care provider. If they do not have a primary care provider, information for follow-up has been provided to them. All questions have been answered.  ____________________________________________  FINAL CLINICAL IMPRESSION(S) / ED DIAGNOSES  Final diagnoses:  Acute bilateral low back pain without sciatica     MEDICATIONS GIVEN DURING THIS VISIT:  Medications - No data to display   NEW OUTPATIENT MEDICATIONS STARTED DURING THIS VISIT:  New Prescriptions   IBUPROFEN (ADVIL,MOTRIN) 800 MG TABLET    Take 1 tablet (800 mg total) by mouth every 8 (eight) hours as needed.   OXYCODONE-ACETAMINOPHEN (PERCOCET/ROXICET) 5-325 MG TABLET    Take 1 tablet by mouth every 8 (eight) hours as needed for severe pain.      Note:  This document was prepared using Dragon voice recognition software and may include unintentional dictation errors.  Alona Bene, MD Emergency Medicine   Long, Arlyss Repress, MD 08/11/16 [phone]

## 2016-08-10 NOTE — ED Triage Notes (Signed)
Patient states that he was at work 2 days ago and he started to have back pain. The patient reports that the pain is lower back and he has had back problems in the past

## 2017-12-18 IMAGING — DX DG CHEST 2V
2 series · 2 of 2 positions shown · non-contrast
Comparison: None.

CLINICAL DATA: Productive cough. Chest pain and left-sided low back
pain. Smoker.

EXAM:
CHEST  2 VIEW

[chest pa]
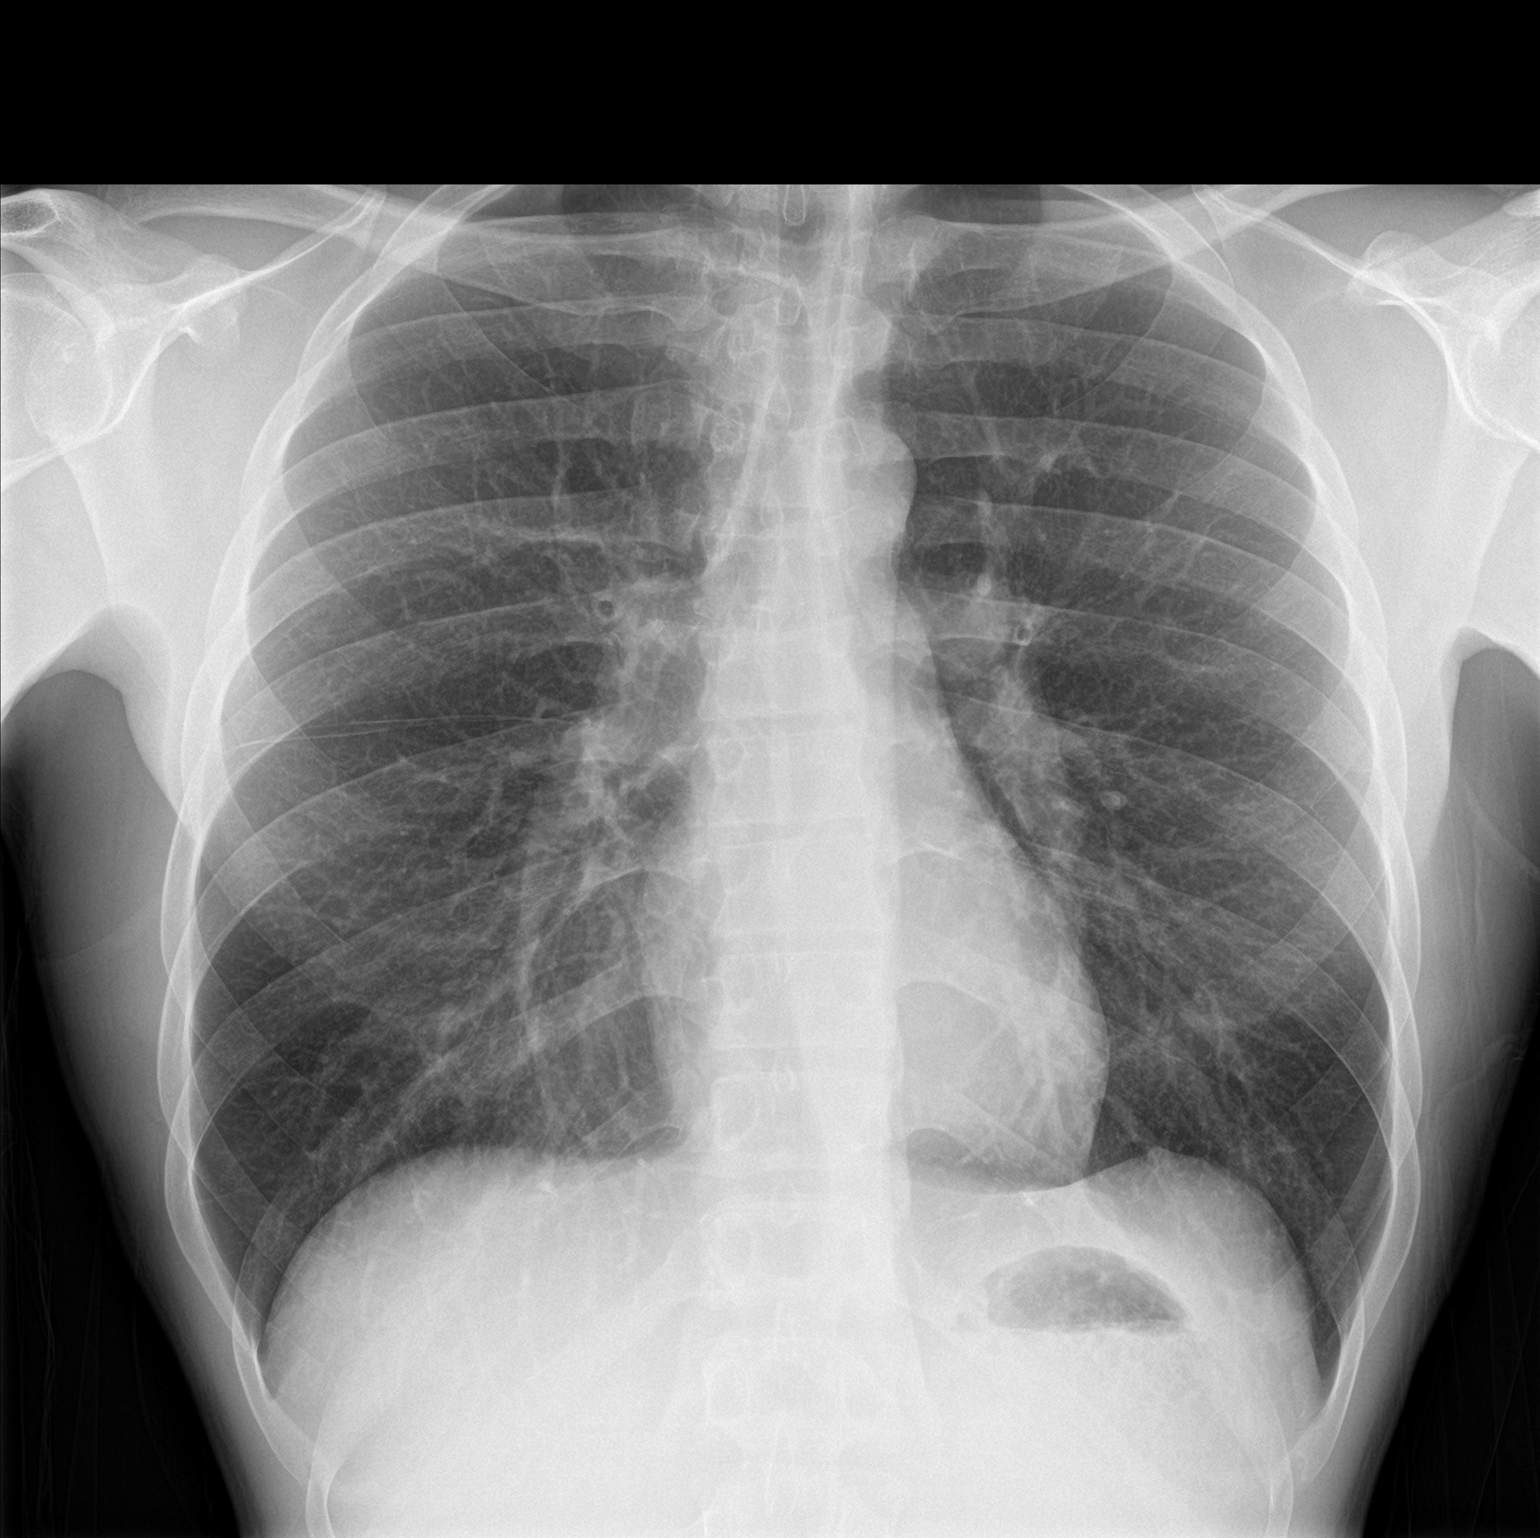

[chest lat]
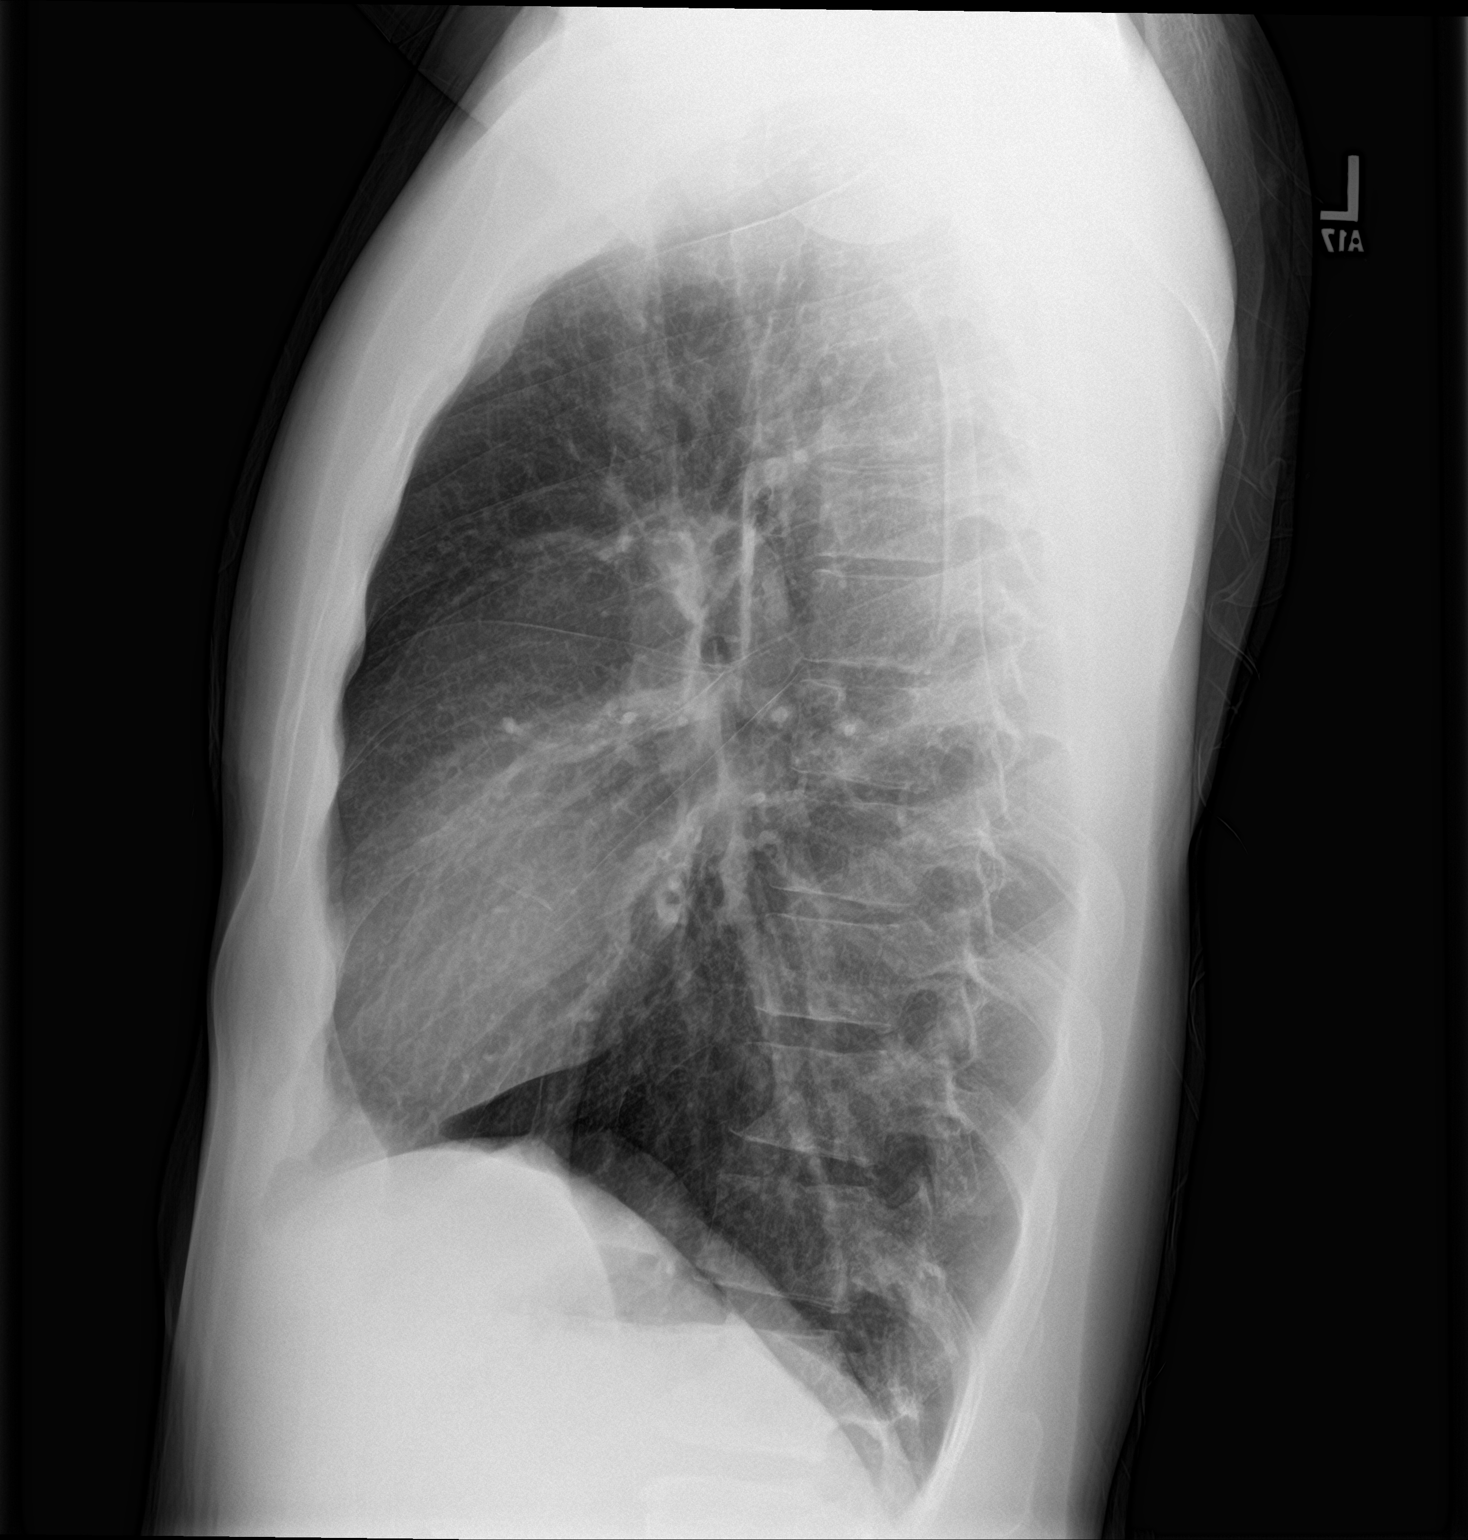

[2 of 2 positions shown; findings below may reference images not displayed]

FINDINGS: The cardiomediastinal silhouette is within normal limits. The lungs
are normally to mildly hyperinflated without evidence of airspace
consolidation, edema, pleural effusion, or pneumothorax. No acute
osseous abnormality is seen.
IMPRESSION: No active cardiopulmonary disease.
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
## 2018-04-27 ENCOUNTER — Other Ambulatory Visit: Payer: Self-pay

## 2018-04-27 ENCOUNTER — Encounter (HOSPITAL_BASED_OUTPATIENT_CLINIC_OR_DEPARTMENT_OTHER): Payer: Self-pay | Admitting: *Deleted

## 2018-04-27 DIAGNOSIS — M545 Low back pain: Secondary | ICD-10-CM | POA: Insufficient documentation

## 2018-04-27 DIAGNOSIS — F17210 Nicotine dependence, cigarettes, uncomplicated: Secondary | ICD-10-CM | POA: Insufficient documentation

## 2018-04-27 DIAGNOSIS — J45909 Unspecified asthma, uncomplicated: Secondary | ICD-10-CM | POA: Insufficient documentation

## 2018-04-27 DIAGNOSIS — Z79899 Other long term (current) drug therapy: Secondary | ICD-10-CM | POA: Insufficient documentation

## 2018-04-27 LAB — URINALYSIS, ROUTINE W REFLEX MICROSCOPIC
Glucose, UA: 100 mg/dL — AB
HGB URINE DIPSTICK: NEGATIVE
KETONES UR: NEGATIVE mg/dL
LEUKOCYTES UA: NEGATIVE
Nitrite: NEGATIVE
PH: 6 (ref 5.0–8.0)
PROTEIN: NEGATIVE mg/dL
Specific Gravity, Urine: >1.03 — ABNORMAL HIGH (ref 1.005–1.030)

## 2018-04-27 NOTE — ED Triage Notes (Signed)
Lower back pain x 6 days ago. Pain after moving furniture.

## 2018-04-28 ENCOUNTER — Emergency Department (HOSPITAL_BASED_OUTPATIENT_CLINIC_OR_DEPARTMENT_OTHER)
Admission: EM | Admit: 2018-04-28 | Discharge: 2018-04-28 | Disposition: A | Discharge status: Home / Self Care | Payer: Self-pay | Attending: Emergency Medicine | Admitting: Emergency Medicine

## 2018-04-28 ENCOUNTER — Encounter (HOSPITAL_BASED_OUTPATIENT_CLINIC_OR_DEPARTMENT_OTHER): Payer: Self-pay | Admitting: Emergency Medicine

## 2018-04-28 DIAGNOSIS — M545 Low back pain, unspecified: Secondary | ICD-10-CM

## 2018-04-28 HISTORY — DX: Low back pain: M54.5

## 2018-04-28 HISTORY — DX: Low back pain, unspecified: M54.50

## 2018-04-28 MED ORDER — OXYCODONE-ACETAMINOPHEN 5-325 MG PO TABS
2.0000 | ORAL_TABLET | Freq: Once | ORAL | Status: AC
  Administered 2018-04-28: 2 via ORAL
  Filled 2018-04-28: qty 2

## 2018-04-28 MED ORDER — OXYCODONE-ACETAMINOPHEN 5-325 MG PO TABS: 1.0000 | ORAL_TABLET | Freq: Four times a day (QID) | ORAL | 0 refills | Status: DC | PRN

## 2018-04-28 NOTE — ED Provider Notes (Signed)
[facility] Provider Note: Lowella Dell, MD, FACEP  CSN: [id number] MRN: [id number] ARRIVAL: 04/27/18 at 2246 ROOM: [facility]   CHIEF COMPLAINT  Back Pain   HISTORY OF PRESENT ILLNESS  04/28/18 1:05 AM Thomas [name] is a 38 y.o. male with a history of low back pain.  He is here with 6 days of sharp midline lower lumbar pain, radiating to the left, after moving furniture.  He describes the pain as a 9.5 out of 10. It is worse with bending over or lifting.  He denies numbness or pain in his legs.  Denies bowel or bladder changes.  He has been taking seizure medicine without relief.   Past Medical History:  Diagnosis Date  . Allergy   . Asthma   . Low back pain     Past Surgical History:  Procedure Laterality Date  . HAND SURGERY Right     Family History  Problem Relation Age of Onset  . Diabetes Mother     Social History   Tobacco Use  . Smoking status: Current Every Day Smoker    Packs/day: 0.50    Types: Cigarettes  . Smokeless tobacco: Never Used  Substance Use Topics  . Alcohol use: No    Alcohol/week: 0.0 standard drinks  . Drug use: No    Prior to Admission medications   Medication Sig Start Date End Date Taking? Authorizing Provider  baclofen (LIORESAL) 10 MG tablet Take 1 tablet (10 mg total) by mouth 3 (three) times daily. 01/05/16   Arthor Captain, PA-C  HYDROcodone-acetaminophen (NORCO) 5-325 MG tablet Take 1-2 tablets by mouth every 6 (six) hours as needed. 01/13/16   Geoffery Lyons, MD  ibuprofen (ADVIL,MOTRIN) 800 MG tablet Take 1 tablet (800 mg total) by mouth every 8 (eight) hours as needed. 08/10/16   Long, Arlyss Repress, MD  naproxen (NAPROSYN) 375 MG tablet Take 1 tablet (375 mg total) by mouth 2 (two) times daily. 01/05/16   Arthor Captain, PA-C  oxyCODONE-acetaminophen (PERCOCET/ROXICET) 5-325 MG tablet Take 1 tablet by mouth every 8 (eight) hours as needed for severe pain. 08/10/16   Long, Arlyss Repress, MD    Allergies Patient has no  known allergies.   REVIEW OF SYSTEMS  Negative except as noted here or in the History of Present Illness.   PHYSICAL EXAMINATION  Initial Vital Signs Blood pressure (!) 155/81, pulse 94, temperature 98.1 F (36.7 C), temperature source Oral, resp. rate 18, height 6\' 2"  (1.88 m), weight 97.5 kg, SpO2 98 %.  Examination General: Well-developed, well-nourished male in no acute distress; appearance consistent with age of record HENT: normocephalic; atraumatic Eyes: Normal appearance Neck: supple Heart: regular rate and rhythm Lungs: clear to auscultation bilaterally Abdomen: soft; nondistended; nontender; bowel sounds present Back: Midline and left lower lumbar tenderness; negative straight leg raise bilaterally Extremities: No deformity; full range of motion; pulses normal Neurologic: Awake, alert and oriented; motor function intact in all extremities and symmetric; no facial droop Skin: Warm and dry Psychiatric: Normal mood and affect   RESULTS  Summary of this visit's results, reviewed by myself:   EKG Interpretation  Date/Time:    Ventricular Rate:    PR Interval:    QRS Duration:   QT Interval:    QTC Calculation:   R Axis:     Text Interpretation:        Laboratory Studies: Results for orders placed or performed during the hospital encounter of 04/28/18 (from the past 24 hour(s))  Urinalysis, Routine  w reflex microscopic     Status: Abnormal   Collection Time: 04/27/18 10:56 PM  Result Value Ref Range   Color, Urine YELLOW YELLOW   APPearance CLEAR CLEAR   Specific Gravity, Urine >1.030 (H) 1.005 - 1.030   pH 6.0 5.0 - 8.0   Glucose, UA 100 (A) NEGATIVE mg/dL   Hgb urine dipstick NEGATIVE NEGATIVE   Bilirubin Urine SMALL (A) NEGATIVE   Ketones, ur NEGATIVE NEGATIVE mg/dL   Protein, ur NEGATIVE NEGATIVE mg/dL   Nitrite NEGATIVE NEGATIVE   Leukocytes, UA NEGATIVE NEGATIVE   Imaging Studies: No results found.  ED COURSE and MDM  Nursing notes and initial  vitals signs, including pulse oximetry, reviewed.  Vitals:   04/27/18 2249 04/27/18 2251  BP:  (!) 155/81  Pulse:  94  Resp:  18  Temp:  98.1 F (36.7 C)  TempSrc:  Oral  SpO2:  98%  Weight: 97.5 kg   Height: 6\' 2"  (1.88 m)     PROCEDURES    ED DIAGNOSES     ICD-10-CM   1. Acute midline low back pain without sciatica M54.5        Brenn Deziel, Jonny Ruiz, MD 04/28/18 [phone]

## 2018-04-28 NOTE — ED Notes (Signed)
Pt ambulated to room without assist.

## 2019-04-04 ENCOUNTER — Emergency Department (HOSPITAL_BASED_OUTPATIENT_CLINIC_OR_DEPARTMENT_OTHER): Payer: Self-pay

## 2019-04-04 ENCOUNTER — Emergency Department (HOSPITAL_BASED_OUTPATIENT_CLINIC_OR_DEPARTMENT_OTHER)
Admission: EM | Admit: 2019-04-04 | Discharge: 2019-04-04 | Disposition: A | Discharge status: Home / Self Care | Payer: Self-pay | Attending: Emergency Medicine | Admitting: Emergency Medicine

## 2019-04-04 ENCOUNTER — Encounter (HOSPITAL_BASED_OUTPATIENT_CLINIC_OR_DEPARTMENT_OTHER): Payer: Self-pay | Admitting: *Deleted

## 2019-04-04 ENCOUNTER — Other Ambulatory Visit: Payer: Self-pay

## 2019-04-04 DIAGNOSIS — F17210 Nicotine dependence, cigarettes, uncomplicated: Secondary | ICD-10-CM | POA: Insufficient documentation

## 2019-04-04 DIAGNOSIS — M545 Low back pain, unspecified: Secondary | ICD-10-CM

## 2019-04-04 DIAGNOSIS — Z20828 Contact with and (suspected) exposure to other viral communicable diseases: Secondary | ICD-10-CM | POA: Insufficient documentation

## 2019-04-04 DIAGNOSIS — R05 Cough: Secondary | ICD-10-CM | POA: Insufficient documentation

## 2019-04-04 DIAGNOSIS — J45909 Unspecified asthma, uncomplicated: Secondary | ICD-10-CM | POA: Insufficient documentation

## 2019-04-04 DIAGNOSIS — J069 Acute upper respiratory infection, unspecified: Secondary | ICD-10-CM | POA: Insufficient documentation

## 2019-04-04 DIAGNOSIS — R0981 Nasal congestion: Secondary | ICD-10-CM | POA: Insufficient documentation

## 2019-04-04 MED ORDER — OXYCODONE-ACETAMINOPHEN 5-325 MG PO TABS: 1.0000 | ORAL_TABLET | Freq: Four times a day (QID) | ORAL | 0 refills | Status: DC | PRN

## 2019-04-04 NOTE — ED Triage Notes (Signed)
Pt c/o cough and congestion x 2 weeks, mid back pain with cough x 2 days

## 2019-04-04 NOTE — ED Provider Notes (Signed)
[facility] EMERGENCY DEPARTMENT Provider Note   CSN: [id number] Arrival date & time: 04/04/19  1455     History Chief Complaint  Patient presents with  . Cough    Thomas [name] is a 38 y.o. male.  Patient is a 38 year old male who presents with back pain.  He has had about a 2-week history of some cough and congestion.  He initially had a fever but it only lasted 1 to 2 days and that was about 2 weeks ago.  He has had no fever since that time.  He continues to have a cough which is productive of some yellow mucus.  He has no shortness of breath.  No leg pain or swelling.  No vomiting or diarrhea.  He has intermittent problems with his low back.  He works as a Actor.  He says once or twice a year his back flares up and he has to come in to get a prescription for some pain medicine for a few days.  His last time that he was seen for this was about a year ago.  He says the coughing has flared up his low back.  The same pain that has had in the past.  It is across his lower lumbar area.  There is no radiation down his legs.  No numbness or weakness to his extremities.  No loss of bowel or bladder control.  No recent injuries other than the coughing.        Past Medical History:  Diagnosis Date  . Allergy   . Asthma   . Low back pain     There are no problems to display for this patient.   Past Surgical History:  Procedure Laterality Date  . HAND SURGERY Right        Family History  Problem Relation Age of Onset  . Diabetes Mother     Social History   Tobacco Use  . Smoking status: Current Every Day Smoker    Packs/day: 0.50    Types: Cigarettes  . Smokeless tobacco: Never Used  Substance Use Topics  . Alcohol use: No    Alcohol/week: 0.0 standard drinks  . Drug use: No    Home Medications Prior to Admission medications   Medication Sig Start Date End Date Taking? Authorizing Provider  oxyCODONE-acetaminophen (PERCOCET/ROXICET) 5-325 MG tablet  Take 1-2 tablets by mouth every 6 (six) hours as needed for severe pain. 04/04/19   Malvin Johns, MD    Allergies    Patient has no known allergies.  Review of Systems   Review of Systems  Constitutional: Negative for chills, diaphoresis, fatigue and fever.  HENT: Negative for congestion, rhinorrhea and sneezing.   Eyes: Negative.   Respiratory: Positive for cough. Negative for chest tightness and shortness of breath.   Cardiovascular: Negative for chest pain and leg swelling.  Gastrointestinal: Negative for abdominal pain, blood in stool, diarrhea, nausea and vomiting.  Genitourinary: Negative for difficulty urinating, flank pain, frequency and hematuria.  Musculoskeletal: Positive for back pain. Negative for arthralgias.  Skin: Negative for rash.  Neurological: Negative for dizziness, speech difficulty, weakness, numbness and headaches.    Physical Exam Updated Vital Signs BP (!) 162/107   Pulse (!) 109   Temp 98.4 F (36.9 C) (Oral)   Resp 14   Ht 6\' 2"  (1.88 m)   Wt 83 kg   SpO2 100%   BMI 23.49 kg/m   Physical Exam Constitutional:      Appearance: He  is well-developed.  HENT:     Head: Normocephalic and atraumatic.  Eyes:     Pupils: Pupils are equal, round, and reactive to light.  Cardiovascular:     Rate and Rhythm: Normal rate and regular rhythm.     Heart sounds: Normal heart sounds.  Pulmonary:     Effort: Pulmonary effort is normal. No respiratory distress.     Breath sounds: Normal breath sounds. No wheezing or rales.  Chest:     Chest wall: No tenderness.  Abdominal:     General: Bowel sounds are normal.     Palpations: Abdomen is soft.     Tenderness: There is no abdominal tenderness. There is no guarding or rebound.  Musculoskeletal:        General: Normal range of motion.     Cervical back: Normal range of motion and neck supple.     Comments: Positive tenderness across the musculature of the lower back bilaterally.  Negative straight leg raise  bilaterally.  Patellar reflexes symmetric bilaterally.  He has normal sensation and motor function to the lower extremities bilaterally.  Pedal pulses are intact.  Lymphadenopathy:     Cervical: No cervical adenopathy.  Skin:    General: Skin is warm and dry.     Findings: No rash.  Neurological:     Mental Status: He is alert and oriented to person, place, and time.     ED Results / Procedures / Treatments   Labs (all labs ordered are listed, but only abnormal results are displayed) Labs Reviewed  SARS CORONAVIRUS 2 (TAT 6-24 HRS)    EKG None  Radiology DG Chest Portable 1 View  Result Date: 04/04/2019 CLINICAL DATA:  Cough, congestion x2 weeks EXAM: PORTABLE CHEST 1 VIEW COMPARISON:  01/05/2016 FINDINGS: Lungs are clear.  No pleural effusion or pneumothorax. The heart is normal in size. IMPRESSION: No evidence of acute cardiopulmonary disease. Electronically Signed   By: Charline Bills M.D.   On: 04/04/2019 16:03    Procedures Procedures (including critical care time)  Medications Ordered in ED Medications - No data to display  ED Course  I have reviewed the triage vital signs and the nursing notes.  Pertinent labs & imaging results that were available during my care of the patient were reviewed by me and considered in my medical decision making (see chart for details).    MDM Rules/Calculators/A&P                      Patient had a chest x-ray which shows no evidence of pneumonia.  He has no hypoxia.  He is otherwise well-appearing.  He has some low back pain which is consistent with his prior exacerbations.  He has no neurologic deficits or signs of cauda equina.  He was discharged home in good condition.  He was given a prescription for short course of Percocet.  He was encouraged to follow-up with a primary care physician if his symptoms are improving.  Return precautions were given. Final Clinical Impression(s) / ED Diagnoses Final diagnoses:  Viral URI with  cough  Acute bilateral low back pain without sciatica    Rx / DC Orders ED Discharge Orders         Ordered    oxyCODONE-acetaminophen (PERCOCET/ROXICET) 5-325 MG tablet  Every 6 hours PRN     04/04/19 1957           [name], MD 04/04/19 2006

## 2019-04-05 LAB — SARS CORONAVIRUS 2 (TAT 6-24 HRS): SARS Coronavirus 2: NEGATIVE

## 2019-05-30 ENCOUNTER — Other Ambulatory Visit: Payer: Self-pay

## 2019-05-30 ENCOUNTER — Encounter (HOSPITAL_BASED_OUTPATIENT_CLINIC_OR_DEPARTMENT_OTHER): Payer: Self-pay | Admitting: Oncology

## 2019-05-30 ENCOUNTER — Emergency Department (HOSPITAL_BASED_OUTPATIENT_CLINIC_OR_DEPARTMENT_OTHER)
Admission: EM | Admit: 2019-05-30 | Discharge: 2019-05-30 | Disposition: A | Discharge status: Home / Self Care | Payer: Self-pay | Attending: Emergency Medicine | Admitting: Emergency Medicine

## 2019-05-30 DIAGNOSIS — J45909 Unspecified asthma, uncomplicated: Secondary | ICD-10-CM | POA: Insufficient documentation

## 2019-05-30 DIAGNOSIS — F17210 Nicotine dependence, cigarettes, uncomplicated: Secondary | ICD-10-CM | POA: Insufficient documentation

## 2019-05-30 DIAGNOSIS — M545 Low back pain, unspecified: Secondary | ICD-10-CM

## 2019-05-30 MED ORDER — IBUPROFEN 800 MG PO TABS
800.0000 mg | ORAL_TABLET | Freq: Once | ORAL | Status: AC
  Administered 2019-05-30: 800 mg via ORAL
  Filled 2019-05-30: qty 1

## 2019-05-30 MED ORDER — IBUPROFEN 800 MG PO TABS: 800.0000 mg | ORAL_TABLET | Freq: Three times a day (TID) | ORAL | 0 refills | Status: DC | PRN

## 2019-05-30 MED ORDER — ONDANSETRON 4 MG PO TBDP
4.0000 mg | ORAL_TABLET | Freq: Once | ORAL | Status: AC
  Administered 2019-05-30: 4 mg via ORAL
  Filled 2019-05-30: qty 1

## 2019-05-30 MED ORDER — OXYCODONE-ACETAMINOPHEN 5-325 MG PO TABS: 2.0000 | ORAL_TABLET | Freq: Four times a day (QID) | ORAL | 0 refills | Status: DC | PRN

## 2019-05-30 MED ORDER — OXYCODONE-ACETAMINOPHEN 5-325 MG PO TABS
2.0000 | ORAL_TABLET | Freq: Once | ORAL | Status: AC
  Administered 2019-05-30: 2 via ORAL
  Filled 2019-05-30: qty 2

## 2019-05-30 MED ORDER — ONDANSETRON 4 MG PO TBDP: 4.0000 mg | ORAL_TABLET | Freq: Four times a day (QID) | ORAL | 0 refills | Status: DC | PRN

## 2019-05-30 NOTE — ED Triage Notes (Signed)
Pt c/o sudden onset low back pain that radiates down left anterior leg.  Denies any injury or heavy lifting.

## 2019-05-30 NOTE — ED Provider Notes (Signed)
TIME SEEN: 5:17 AM  CHIEF COMPLAINT: Left lower back pain  HPI: Patient is a 39 year old male with history of asthma and previous episodes of back pain who presents to emergency department with left lower back pain that started tonight.  Worse with movement and palpation.  States that he did not have any direct injury but states pain started after washing a car yesterday.  No numbness, tingling or weakness.  Pain radiates into the front of his thigh.  No history of kidney stones.  No bowel or bladder incontinence.  No urinary retention.  No fever.  Reports he is able to ambulate but it is painful when he does so slowly.  No history of HIV, diabetes, cancer.  ROS: See HPI Constitutional: no fever  Eyes: no drainage  ENT: no runny nose   Cardiovascular:  no chest pain  Resp: no SOB  GI: no vomiting GU: no dysuria Integumentary: no rash  Allergy: no hives  Musculoskeletal: no leg swelling  Neurological: no slurred speech ROS otherwise negative  PAST MEDICAL HISTORY/PAST SURGICAL HISTORY:  Past Medical History:  Diagnosis Date  . Allergy   . Asthma   . Low back pain     MEDICATIONS:  Prior to Admission medications   Medication Sig Start Date End Date Taking? Authorizing Provider  oxyCODONE-acetaminophen (PERCOCET/ROXICET) 5-325 MG tablet Take 1-2 tablets by mouth every 6 (six) hours as needed for severe pain. 04/04/19   [name], MD    ALLERGIES:  No Known Allergies  SOCIAL HISTORY:  Social History   Tobacco Use  . Smoking status: Current Every Day Smoker    Packs/day: 0.50    Types: Cigarettes  . Smokeless tobacco: Never Used  Substance Use Topics  . Alcohol use: No    Alcohol/week: 0.0 standard drinks    FAMILY HISTORY: Family History  Problem Relation Age of Onset  . Diabetes Mother     EXAM: BP (!) 162/97 (BP Location: Right Arm)   Pulse 86   Temp 98.4 F (36.9 C) (Oral)   Resp 20   Ht 6\' 2"  (1.88 m)   Wt 89.8 kg   SpO2 100%   BMI 25.42 kg/m   CONSTITUTIONAL: Alert and oriented and responds appropriately to questions. Well-appearing; well-nourished, appears comfortable when lying flat but appears very uncomfortable when trying to sit upright in the bed for me to look at his back HEAD: Normocephalic EYES: Conjunctivae clear, pupils appear equal, EOM appear intact ENT: normal nose; moist mucous membranes NECK: Supple, normal ROM CARD: RRR; S1 and S2 appreciated; no murmurs, no clicks, no rubs, no gallops RESP: Normal chest excursion without splinting or tachypnea; breath sounds clear and equal bilaterally; no wheezes, no rhonchi, no rales, no hypoxia or respiratory distress, speaking full sentences ABD/GI: Normal bowel sounds; non-distended; soft, non-tender, no rebound, no guarding, no peritoneal signs, no hepatosplenomegaly BACK:  The back appears normal, tender to palpation over the left lumbar paraspinal muscles, no midline spinal tenderness or step-off or deformity, no redness or warmth, no ecchymosis or swelling EXT: Normal ROM in all joints; no deformity noted, no edema; no cyanosis SKIN: Normal color for age and race; warm; no rash on exposed skin NEURO: Moves all extremities equally, normal sensation diffusely, no clonus, no hyperreflexia, no saddle anesthesia PSYCH: The patient's mood and manner are appropriate.   MEDICAL DECISION MAKING: Patient here with back pain.  Suspect muscle strain, spasm.  Appears very uncomfortable here.  Will discharge with Percocet, ibuprofen.  drove him  here to the emergency department.  Pain medication given here in the ED.  Recommended alternating ice and heat.  Recommended stretching and limited activity.  Patient comfortable with plan.  Discussed return precautions.  Doubt cauda equina, epidural abscess or hematoma, discitis or osteomyelitis, fracture, kidney stone, diverticulitis.  I do not feel he needs emergent imaging.     At this time, I do not feel there is any life-threatening  condition present. I have reviewed, interpreted and discussed all results (EKG, imaging, lab, urine as appropriate) and exam findings with patient/family. I have reviewed nursing notes and appropriate previous records.  I feel the patient is safe to be discharged home without further emergent workup and can continue workup as an outpatient as needed. Discussed usual and customary return precautions. Patient/family verbalize understanding and are comfortable with this plan.  Outpatient follow-up has been provided as needed. All questions have been answered.      Thomas [name] was evaluated in Emergency Department on 05/30/2019 for the symptoms described in the history of present illness. He was evaluated in the context of the global COVID-19 pandemic, which necessitated consideration that the patient might be at risk for infection with the SARS-CoV-2 virus that causes COVID-19. Institutional protocols and algorithms that pertain to the evaluation of patients at risk for COVID-19 are in a state of rapid change based on information released by regulatory bodies including the CDC and federal and state organizations. These policies and algorithms were followed during the patient's care in the ED.  Patient was seen wearing N95, face shield, gloves.    Thomas [name], Delice Bison, DO 05/30/19 [phone]

## 2019-05-30 NOTE — Discharge Instructions (Signed)
Steps to find a Primary Care Provider (PCP): ° °Call [phone] or [phone] to access "[facility] Find a Doctor Service." ° °2.  You may also go on the [facility] website at [email or web address] ° °3.  [facility] also frequently accepts new patients. ° °[facility]  °[address] °[place] [address] °[phone] ° °4.  There are also multiple [facility], [facility] and [facility] practices throughout the [place] that are frequently accepting new patients. You may find a clinic that is close to your home and contact them. ° °[facility] °[email or web address] °[phone] ° °[facility] °[email or web address] ° °[facility] °[email or web address] °[phone] ° °[facility] °[email or web address] °[phone] ° °5.  Local Health Departments also can provide primary care services. ° °[facility]  °[address] °[place] [address] [phone] ° °[facility] °[address] °[place] [address] °[phone] ° °[facility] °[address]  °[place] [address] °[phone] ° ° °

## 2019-09-09 ENCOUNTER — Emergency Department (HOSPITAL_BASED_OUTPATIENT_CLINIC_OR_DEPARTMENT_OTHER)
Admission: EM | Admit: 2019-09-09 | Discharge: 2019-09-09 | Disposition: A | Discharge status: Home / Self Care | Payer: Self-pay | Attending: Emergency Medicine | Admitting: Emergency Medicine

## 2019-09-09 ENCOUNTER — Other Ambulatory Visit: Payer: Self-pay

## 2019-09-09 ENCOUNTER — Encounter (HOSPITAL_BASED_OUTPATIENT_CLINIC_OR_DEPARTMENT_OTHER): Payer: Self-pay | Admitting: Emergency Medicine

## 2019-09-09 DIAGNOSIS — F17210 Nicotine dependence, cigarettes, uncomplicated: Secondary | ICD-10-CM | POA: Insufficient documentation

## 2019-09-09 DIAGNOSIS — M545 Low back pain, unspecified: Secondary | ICD-10-CM

## 2019-09-09 DIAGNOSIS — X500XXA Overexertion from strenuous movement or load, initial encounter: Secondary | ICD-10-CM | POA: Insufficient documentation

## 2019-09-09 DIAGNOSIS — J45909 Unspecified asthma, uncomplicated: Secondary | ICD-10-CM | POA: Insufficient documentation

## 2019-09-09 MED ORDER — KETOROLAC TROMETHAMINE 60 MG/2ML IM SOLN
60.0000 mg | Freq: Once | INTRAMUSCULAR | Status: AC
  Administered 2019-09-09: 60 mg via INTRAMUSCULAR
  Filled 2019-09-09: qty 2

## 2019-09-09 MED ORDER — DIAZEPAM 5 MG/ML IJ SOLN
5.0000 mg | Freq: Once | INTRAMUSCULAR | Status: AC
  Administered 2019-09-09: 5 mg via INTRAMUSCULAR
  Filled 2019-09-09: qty 2

## 2019-09-09 MED ORDER — METHOCARBAMOL 500 MG PO TABS: 500.0000 mg | ORAL_TABLET | Freq: Two times a day (BID) | ORAL | 0 refills | Status: DC

## 2019-09-09 MED ORDER — OXYCODONE-ACETAMINOPHEN 5-325 MG PO TABS
1.0000 | ORAL_TABLET | Freq: Once | ORAL | Status: AC
  Administered 2019-09-09: 1 via ORAL
  Filled 2019-09-09: qty 1

## 2019-09-09 MED ORDER — DEXAMETHASONE SODIUM PHOSPHATE 10 MG/ML IJ SOLN
10.0000 mg | Freq: Once | INTRAMUSCULAR | Status: AC
  Administered 2019-09-09: 10 mg via INTRAMUSCULAR
  Filled 2019-09-09: qty 1

## 2019-09-09 MED ORDER — PREDNISONE 20 MG PO TABS: 20.0000 mg | ORAL_TABLET | Freq: Every day | ORAL | 0 refills | Status: AC

## 2019-09-09 MED ORDER — MELOXICAM 7.5 MG PO TABS: 7.5000 mg | ORAL_TABLET | Freq: Every day | ORAL | 0 refills | Status: AC

## 2019-09-09 NOTE — ED Triage Notes (Signed)
L low back pain x 3 days. No known injury. Hx of same.

## 2019-09-09 NOTE — Discharge Instructions (Signed)
Take prednisone, mobic, and Robaxin as directed for pain.   As we discussed you can use heat to help with pain.   Return to the Emergency Department immediately for any worsening back pain, neck pain, difficulty walking, numbness/weaknss of your arms or legs, urinary or bowel accidents, fever or any other worsening or concerning symptoms.

## 2019-09-09 NOTE — ED Provider Notes (Signed)
[facility] EMERGENCY DEPARTMENT Provider Note   CSN: [id number] Arrival date & time: 09/09/19  1328     History Chief Complaint  Patient presents with  . Back Pain    Thomas [name] is a 39 y.o. male who presents for evaluation of 3 days of lower back pain.  He reports that at work, he lifts heavy things and reports about 3 days ago, he was lifting something when all of a sudden he felt pain in his lower back.  Since then, he has had pain in his lower back that radiates to both sides.  He states that it is worse with movement and when he sits still too long.  He states that particularly getting up from a sitting and standing position makes his pain worse.  He has been taking Tylenol and ibuprofen with minimal improvement.  He denies any trauma, fall.  Denies any abdominal pain, urinary complaints. Denies fevers, weight loss, numbness/weakness of upper and lower extremities, bowel/bladder incontinence, saddle anesthesia, history of back surgery, history of IVDA.  The history is provided by the patient.       Past Medical History:  Diagnosis Date  . Allergy   . Asthma   . Low back pain     There are no problems to display for this patient.   Past Surgical History:  Procedure Laterality Date  . HAND SURGERY Right        Family History  Problem Relation Age of Onset  . Diabetes Mother     Social History   Tobacco Use  . Smoking status: Current Every Day Smoker    Packs/day: 0.50    Types: Cigarettes  . Smokeless tobacco: Never Used  Substance Use Topics  . Alcohol use: No    Alcohol/week: 0.0 standard drinks  . Drug use: No    Home Medications Prior to Admission medications   Medication Sig Start Date End Date Taking? Authorizing Provider  ibuprofen (ADVIL) 800 MG tablet Take 1 tablet (800 mg total) by mouth every 8 (eight) hours as needed for mild pain. 05/30/19   Ward, Layla Maw, DO  meloxicam (MOBIC) 7.5 MG tablet Take 1 tablet (7.5 mg total) by  mouth daily for 14 days. 09/09/19 09/23/19  Maxwell Caul, PA-C  methocarbamol (ROBAXIN) 500 MG tablet Take 1 tablet (500 mg total) by mouth 2 (two) times daily. 09/09/19   Maxwell Caul, PA-C  ondansetron (ZOFRAN ODT) 4 MG disintegrating tablet Take 1 tablet (4 mg total) by mouth every 6 (six) hours as needed for nausea or vomiting. 05/30/19   Ward, Layla Maw, DO  oxyCODONE-acetaminophen (PERCOCET/ROXICET) 5-325 MG tablet Take 2 tablets by mouth every 6 (six) hours as needed for severe pain. 05/30/19   Ward, Layla Maw, DO  predniSONE (DELTASONE) 20 MG tablet Take 1 tablet (20 mg total) by mouth daily for 4 days. 09/09/19 09/13/19  Maxwell Caul, PA-C    Allergies    Patient has no known allergies.  Review of Systems   Review of Systems  Constitutional: Negative for fever.  Musculoskeletal: Positive for back pain. Negative for neck pain.  Neurological: Negative for weakness, numbness and headaches.  All other systems reviewed and are negative.   Physical Exam Updated Vital Signs BP 132/78 (BP Location: Right Arm)   Pulse 76   Temp 99 F (37.2 C) (Oral)   Resp 16   Ht 6\' 2"  (1.88 m)   Wt 91.2 kg   SpO2 99%  BMI 25.81 kg/m   Physical Exam Vitals and nursing note reviewed.  Constitutional:      Appearance: He is well-developed.  HENT:     Head: Normocephalic and atraumatic.  Eyes:     General: No scleral icterus.       Right eye: No discharge.        Left eye: No discharge.     Conjunctiva/sclera: Conjunctivae normal.  Neck:     Comments: Full flexion/extension and lateral movement of neck fully intact. No bony midline tenderness. No deformities or crepitus.  Pulmonary:     Effort: Pulmonary effort is normal.  Musculoskeletal:     Comments: No midline T-spine tenderness.  Tenderness palpation noted in the mid and lower lumbar spine no deformity or crepitus noted.  Diffuse muscular tenderness of the bilateral paraspinal muscles of the lower lumbar region.   Flexion/extension of back intact but with subjective reports of pain.  Skin:    General: Skin is warm and dry.  Neurological:     Mental Status: He is alert.     Comments: Follows commands, Moves all extremities  5/5 strength to BUE and BLE  Sensation intact throughout all major nerve distributions Normal gait   Psychiatric:        Speech: Speech normal.        Behavior: Behavior normal.     ED Results / Procedures / Treatments   Labs (all labs ordered are listed, but only abnormal results are displayed) Labs Reviewed - No data to display  EKG None  Radiology No results found.  Procedures Procedures (including critical care time)  Medications Ordered in ED Medications  ketorolac (TORADOL) injection 60 mg (60 mg Intramuscular Given 09/09/19 1442)  dexamethasone (DECADRON) injection 10 mg (10 mg Intramuscular Given 09/09/19 1442)  oxyCODONE-acetaminophen (PERCOCET/ROXICET) 5-325 MG per tablet 1 tablet (1 tablet Oral Given 09/09/19 1441)  diazepam (VALIUM) injection 5 mg (5 mg Intramuscular Given 09/09/19 1442)    ED Course  I have reviewed the triage vital signs and the nursing notes.  Pertinent labs & imaging results that were available during my care of the patient were reviewed by me and considered in my medical decision making (see chart for details).    MDM Rules/Calculators/A&P                      39 year old male who presents for evaluation of 3 days of lower back pain.  Started 3 days ago when he was at work and was lifting something heavy.  No fall, trauma.  Reports pain is worse with moving/bending. Patient is afebrile, non-toxic appearing, sitting comfortably on examination table. Vital signs reviewed and stable.  No red flag symptoms.  No neuro deficits.  Suspect this is most likely musculoskeletal back pain.  Given lack of trauma, injury, fall, no indication for x-ray imaging.  History/physical exam not concerning for cauda equina, spinal abscess.  Plan for  analgesics and reassess.  Reevaluation.  Patient reports feeling more comfortable after analgesics.   He is ambulatory at this time with no neurological deficits.  He has no preceding trauma, injury.  Suspect that this is back pain consistent with musculoskeletal/mechanical back pain.  No indication for emergent MRI imaging.  We will plan to send home patient with Robaxin, Mobic, prednisone.  Patient reviewed on PMP.  At this time, given acute nature of pain, do not feel narcotic pain medication is warranted.  I did discuss this with patient. At this time,  patient exhibits no emergent life-threatening condition that require further evaluation in ED or admission. Patient had ample opportunity for questions and discussion. All patient's questions were answered with full understanding. Strict return precautions discussed. Patient expresses understanding and agreement to plan.   Portions of this note were generated with Lobby[employer]. Dictation errors may occur despite best attempts at proofreading.   Final Clinical Impression(s) / ED Diagnoses Final diagnoses:  Acute bilateral low back pain, unspecified whether sciatica present    Rx / DC Orders ED Discharge Orders         Ordered    methocarbamol (ROBAXIN) 500 MG tablet  2 times daily     09/09/19 1510    meloxicam (MOBIC) 7.5 MG tablet  Daily     09/09/19 1510    predniSONE (DELTASONE) 20 MG tablet  Daily     09/09/19 1510           Desma Mcgregor 09/09/19 1925    Davonna Belling, MD 09/17/19 1441

## 2019-09-09 NOTE — ED Notes (Signed)
Bent over at work 3 days   , had a sharp  Pain, t has hurt every since m. No numbness or tingling , hurts to bend over , taken motrin no help he states , works  At [employer]

## 2020-06-03 ENCOUNTER — Encounter (HOSPITAL_BASED_OUTPATIENT_CLINIC_OR_DEPARTMENT_OTHER): Payer: Self-pay | Admitting: Emergency Medicine

## 2020-06-03 ENCOUNTER — Other Ambulatory Visit: Payer: Self-pay

## 2020-06-03 ENCOUNTER — Emergency Department (HOSPITAL_BASED_OUTPATIENT_CLINIC_OR_DEPARTMENT_OTHER)
Admission: EM | Admit: 2020-06-03 | Discharge: 2020-06-03 | Disposition: A | Discharge status: Home / Self Care | Payer: Self-pay | Attending: Emergency Medicine | Admitting: Emergency Medicine

## 2020-06-03 DIAGNOSIS — S61412A Laceration without foreign body of left hand, initial encounter: Secondary | ICD-10-CM | POA: Insufficient documentation

## 2020-06-03 DIAGNOSIS — X501XXA Overexertion from prolonged static or awkward postures, initial encounter: Secondary | ICD-10-CM | POA: Insufficient documentation

## 2020-06-03 DIAGNOSIS — F17210 Nicotine dependence, cigarettes, uncomplicated: Secondary | ICD-10-CM | POA: Insufficient documentation

## 2020-06-03 DIAGNOSIS — M545 Low back pain, unspecified: Secondary | ICD-10-CM | POA: Insufficient documentation

## 2020-06-03 DIAGNOSIS — Y990 Civilian activity done for income or pay: Secondary | ICD-10-CM | POA: Insufficient documentation

## 2020-06-03 DIAGNOSIS — Y9389 Activity, other specified: Secondary | ICD-10-CM | POA: Insufficient documentation

## 2020-06-03 DIAGNOSIS — J45909 Unspecified asthma, uncomplicated: Secondary | ICD-10-CM | POA: Insufficient documentation

## 2020-06-03 MED ORDER — METHOCARBAMOL 500 MG PO TABS: 500.0000 mg | ORAL_TABLET | Freq: Two times a day (BID) | ORAL | 0 refills | Status: DC

## 2020-06-03 MED ORDER — HYDROCODONE-ACETAMINOPHEN 5-325 MG PO TABS: 1.0000 | ORAL_TABLET | ORAL | 0 refills | Status: DC | PRN

## 2020-06-03 MED ORDER — LIDOCAINE 5 % EX PTCH: 1.0000 | MEDICATED_PATCH | CUTANEOUS | 0 refills | Status: DC

## 2020-06-03 NOTE — Discharge Instructions (Signed)
Take the medications as prescribed.  Do not drive or operate heavy machinery while taking the Norco.  This is an opiate medication which may become addictive.  Only take as needed.  Make sure to apply over-the-counter Neosporin to your laceration.  You should scab over and heal.  Make sure to keep dry over the next few days until scab formed

## 2020-06-03 NOTE — ED Provider Notes (Signed)
[facility] EMERGENCY DEPARTMENT Provider Note   CSN: [id number] Arrival date & time: 06/03/20  1414     History Chief Complaint  Patient presents with  . Extremity Laceration  . Back Pain    Thomas [name] is a 40 y.o. male with past medical history significant for asthma, low back pain who presents for evaluation of back pain and hand laceration.  Does a lot of bending and twisting at work.  States he has diffuse lower back pain.  Does not radiate into his legs.  Has had similar in the past.  No recent injury or trauma.  No history of IV drug use, bowel or bladder incontinence, saddle paresthesia.  Pain worse with movement.  Improved with rest.  Has been taking Tylenol and Aleve at home without relief.  Feels similar to his prior episodes of back pain.  Patient also noted to have laceration to dorsum of left hand between first and second metacarpal.  Occurred 2 days ago.  Patient states "just wants to get it checked out."  No pain, numbness, tingling, redness, swelling, warmth, drainage or bleeding.  Denies additional aggravating or alleviating factors.  He is concerned that he deals with the dishes at work and this area may become infected due to exposure to water.  No fever, chills, nausea, vomiting, chest pain, shortness of breath, abdominal pain, flank pain, paresthesias, weakness, diarrhea, constipation, redness, swelling, warmth.  Denies additional aggravating or alleviating factors.  History obtained from patient and past medical records.  No interpreter used  HPI     Past Medical History:  Diagnosis Date  . Allergy   . Asthma   . Low back pain     There are no problems to display for this patient.   Past Surgical History:  Procedure Laterality Date  . HAND SURGERY Right        Family History  Problem Relation Age of Onset  . Diabetes Mother     Social History   Tobacco Use  . Smoking status: Current Every Day Smoker    Packs/day: 0.50     Types: Cigarettes  . Smokeless tobacco: Never Used  Vaping Use  . Vaping Use: Never used  Substance Use Topics  . Alcohol use: No    Alcohol/week: 0.0 standard drinks  . Drug use: No    Home Medications Prior to Admission medications   Medication Sig Start Date End Date Taking? Authorizing Provider  HYDROcodone-acetaminophen (NORCO/VICODIN) 5-325 MG tablet Take 1 tablet by mouth every 4 (four) hours as needed. 06/03/20  Yes Elyse Prevo A, PA-C  lidocaine (LIDODERM) 5 % Place 1 patch onto the skin daily. Remove & Discard patch within 12 hours or as directed by MD 06/03/20  Yes Ezmae Speers A, PA-C  methocarbamol (ROBAXIN) 500 MG tablet Take 1 tablet (500 mg total) by mouth 2 (two) times daily. 06/03/20  Yes Kynley Metzger A, PA-C  ibuprofen (ADVIL) 800 MG tablet Take 1 tablet (800 mg total) by mouth every 8 (eight) hours as needed for mild pain. 05/30/19   Ward, Layla Maw, DO  ondansetron (ZOFRAN ODT) 4 MG disintegrating tablet Take 1 tablet (4 mg total) by mouth every 6 (six) hours as needed for nausea or vomiting. 05/30/19   Ward, Layla Maw, DO  oxyCODONE-acetaminophen (PERCOCET/ROXICET) 5-325 MG tablet Take 2 tablets by mouth every 6 (six) hours as needed for severe pain. 05/30/19   Ward, Layla Maw, DO    Allergies    Patient has no  known allergies.  Review of Systems   Review of Systems  Constitutional: Negative.   HENT: Negative.   Respiratory: Negative.   Cardiovascular: Negative.   Gastrointestinal: Negative.   Genitourinary: Negative.   Musculoskeletal: Positive for back pain.  Skin: Positive for wound.  Neurological: Negative.   All other systems reviewed and are negative.   Physical Exam Updated Vital Signs BP (!) 141/97 (BP Location: Left Arm)   Pulse 91   Temp 98.9 F (37.2 C) (Oral)   Resp 18   Ht 6\' 2"  (1.88 m)   Wt 104.3 kg   SpO2 98%   BMI 29.53 kg/m   Physical Exam  Physical Exam  Constitutional: Pt appears well-developed and well-nourished. No  distress.  HENT:  Head: Normocephalic and atraumatic.  Mouth/Throat: Oropharynx is clear and moist. No oropharyngeal exudate.  Eyes: Conjunctivae are normal.  Neck: Normal range of motion. Neck supple.  Full ROM without pain  Cardiovascular: Normal rate, regular rhythm and intact distal pulses.   Pulmonary/Chest: Effort normal and breath sounds normal. No respiratory distress. Pt has no wheezes.  Abdominal: Soft. Pt exhibits no distension. There is no tenderness, rebound or guarding. No abd bruit or pulsatile mass Musculoskeletal:  Full range of motion of the T-spine and L-spine with flexion, hyperextension, and lateral flexion. No midline tenderness or stepoffs. No tenderness to palpation of the spinous processes of the T-spine or L-spine. Diffuse tenderness to palpation of the paraspinous muscles of the L-spine. Negative straight leg raise. Lymphadenopathy:    Pt has no cervical adenopathy.  Neurological: Pt is alert. Pt has normal reflexes.  Reflex Scores:      Bicep reflexes are 2+ on the right side and 2+ on the left side.      Brachioradialis reflexes are 2+ on the right side and 2+ on the left side.      Patellar reflexes are 2+ on the right side and 2+ on the left side.      Achilles reflexes are 2+ on the right side and 2+ on the left side. Speech is clear and goal oriented, follows commands Normal 5/5 strength in upper and lower extremities bilaterally including dorsiflexion and plantar flexion, strong and equal grip strength Sensation normal to light and sharp touch Moves extremities without ataxia, coordination intact Normal gait Normal balance No Clonus Skin: Skin is warm and dry. No rash noted. Pt is not diaphoretic. No erythema, ecchymosis,edema or warmth.  6 mm ulceration which is scabbed over to dorsum of right hand between 1st and 2nd metacarpal.  No surrounding erythema, warmth.  No fluctuance or induration. Psychiatric: Pt has a normal mood and affect. Behavior is  normal.  Nursing note and vitals reviewed. ED Results / Procedures / Treatments   Labs (all labs ordered are listed, but only abnormal results are displayed) Labs Reviewed - No data to display  EKG None  Radiology No results found.  Procedures Procedures   Medications Ordered in ED Medications - No data to display  ED Course  I have reviewed the triage vital signs and the nursing notes.  Pertinent labs & imaging results that were available during my care of the patient were reviewed by me and considered in my medical decision making (see chart for details).  40 year old here with back pain.  Noted diffusely across his lumbar spine.  Does not radiate.  Has history of similar.  Afebrile, nonseptic, non-ill-appearing.  Does a lot of bending and twisting at work.  No recent injury or  trauma.  No history of IV drug use, bowel or bladder incontinence, saddle paresthesia or malignancy.  Able to reproduce pain on exam.  Negative straight leg raise bilaterally.  Do not feel we need imaging at this time.  Will treat for MSK pain.  Have low suspicion for cauda equina, discitis, osteomyelitis, transverse myelitis, psoas abscess, acute fracture, disc herniation, myositis, vascular occlusion.  Patient also here with laceration to dorsum of right hand which occurred 2 days ago.  Small, half centimeter laceration.  Does not appear actively infected.  He has a normal musculoskeletal exam.  He is neurovascularly intact.  Patient denies possibility of retained foreign object.  Discussed keeping dry, close eye for any infectious process.  He will return for any worsening symptoms.  The patient has been appropriately medically screened and/or stabilized in the ED. I have low suspicion for any other emergent medical condition which would require further screening, evaluation or treatment in the ED or require inpatient management.  Patient is hemodynamically stable and in no acute distress.  Patient able to  ambulate in department prior to ED.  Evaluation does not show acute pathology that would require ongoing or additional emergent interventions while in the emergency department or further inpatient treatment.  I have discussed the diagnosis with the patient and answered all questions.  Pain is been managed while in the emergency department and patient has no further complaints prior to discharge.  Patient is comfortable with plan discussed in room and is stable for discharge at this time.  I have discussed strict return precautions for returning to the emergency department.  Patient was encouraged to follow-up with PCP/specialist refer to at discharge.    MDM Rules/Calculators/A&P                           Final Clinical Impression(s) / ED Diagnoses Final diagnoses:  Acute bilateral low back pain without sciatica  Laceration of left hand without foreign body, initial encounter    Rx / DC Orders ED Discharge Orders         Ordered    HYDROcodone-acetaminophen (NORCO/VICODIN) 5-325 MG tablet  Every 4 hours PRN        06/03/20 1434    lidocaine (LIDODERM) 5 %  Every 24 hours        06/03/20 1434    methocarbamol (ROBAXIN) 500 MG tablet  2 times daily        06/03/20 1434           Beth Spackman A, PA-C 06/03/20 1449    Terald Sleeper, MD 06/03/20 Windell Moment

## 2020-06-03 NOTE — ED Triage Notes (Signed)
Reports low back pain "again" and lac to left hand from Sunday.  Area noted to be scabbed over.

## 2021-10-20 ENCOUNTER — Emergency Department (HOSPITAL_BASED_OUTPATIENT_CLINIC_OR_DEPARTMENT_OTHER)
Admission: EM | Admit: 2021-10-20 | Discharge: 2021-10-20 | Disposition: A | Discharge status: Home / Self Care | Payer: Self-pay | Attending: Emergency Medicine | Admitting: Emergency Medicine

## 2021-10-20 ENCOUNTER — Other Ambulatory Visit: Payer: Self-pay

## 2021-10-20 ENCOUNTER — Emergency Department (HOSPITAL_COMMUNITY)
Admission: EM | Admit: 2021-10-20 | Discharge: 2021-10-20 | Disposition: A | Discharge status: Home / Self Care | Payer: Self-pay | Source: Home / Self Care

## 2021-10-20 DIAGNOSIS — Z202 Contact with and (suspected) exposure to infections with a predominantly sexual mode of transmission: Secondary | ICD-10-CM

## 2021-10-20 DIAGNOSIS — M545 Low back pain, unspecified: Secondary | ICD-10-CM

## 2021-10-20 LAB — URINALYSIS, ROUTINE W REFLEX MICROSCOPIC
Bilirubin Urine: NEGATIVE
Glucose, UA: NEGATIVE mg/dL
Hgb urine dipstick: NEGATIVE
Ketones, ur: NEGATIVE mg/dL
Leukocytes,Ua: NEGATIVE
Nitrite: NEGATIVE
Protein, ur: NEGATIVE mg/dL
Specific Gravity, Urine: >=1.03 (ref 1.005–1.030)
pH: 5.5 (ref 5.0–8.0)

## 2021-10-20 MED ORDER — KETOROLAC TROMETHAMINE 15 MG/ML IJ SOLN
15.0000 mg | Freq: Once | INTRAMUSCULAR | Status: AC
  Administered 2021-10-20: 15 mg via INTRAMUSCULAR
  Filled 2021-10-20: qty 1

## 2021-10-20 MED ORDER — METHOCARBAMOL 500 MG PO TABS: 500.0000 mg | ORAL_TABLET | Freq: Three times a day (TID) | ORAL | 0 refills | Status: DC | PRN

## 2021-10-20 MED ORDER — PENICILLIN G BENZATHINE 1200000 UNIT/2ML IM SUSY
2.4000 10*6.[IU] | PREFILLED_SYRINGE | Freq: Once | INTRAMUSCULAR | Status: AC
  Administered 2021-10-20: 2.4 10*6.[IU] via INTRAMUSCULAR
  Filled 2021-10-20: qty 4

## 2021-10-20 MED ORDER — OXYCODONE-ACETAMINOPHEN 5-325 MG PO TABS: 1.0000 | ORAL_TABLET | Freq: Three times a day (TID) | ORAL | 0 refills | Status: AC | PRN

## 2021-10-20 NOTE — ED Notes (Signed)
Discharge instructions and recommendations reviewed with patient. Questions answered and states understanding. Ambulatory upon discharge

## 2021-10-20 NOTE — ED Provider Notes (Signed)
[facility] EMERGENCY DEPARTMENT Provider Note   CSN: [id number] Arrival date & time: 10/20/21  1638     History  Chief Complaint  Patient presents with   Back Pain   Exposure to STD    Thomas [name] is a 41 y.o. male with pertinent past medical history of asthma and low back pain.  Presents to the emergency department with a chief complaint of low back pain and exposure to syphilis.  Patient reports that he is sexually active in a mutually monogamous relationship with a male partner.  Patient reports that his partner recently tested positive for syphilis.  Patient came to the emergency department for treatment.  Patient denies any swelling or tenderness to genitals, genital sores or lesions, penile discharge, dysuria, hematuria, urinary urgency, urinary frequency, fevers, chills.  Patient complains of lumbar back pain.  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  Patient reports that he has had lumbar back pain over the last week.  Pain is located to bilateral lumbar back and does not radiate.  Pain is worse with touch and certain movements.  Patient has been trying over OTC medications with minimal improvement in his pain.  Patient denies any numbness, weakness, saddle anesthesia, bowel/bladder dysfunction, IV drug use, history of malignancy.     Back Pain Exposure to STD       Home Medications Prior to Admission medications   Medication Sig Start Date End Date Taking? Authorizing Provider  HYDROcodone-acetaminophen (NORCO/VICODIN) 5-325 MG tablet Take 1 tablet by mouth every 4 (four) hours as needed. 06/03/20   Henderly, Britni A, PA-C  ibuprofen (ADVIL) 800 MG tablet Take 1 tablet (800 mg total) by mouth every 8 (eight) hours as needed for mild pain. 05/30/19   Ward, Layla Maw, DO  lidocaine (LIDODERM) 5 % Place 1 patch onto the skin daily. Remove & Discard patch within 12 hours or as directed by MD 06/03/20   Henderly, Britni A, PA-C   methocarbamol (ROBAXIN) 500 MG tablet Take 1 tablet (500 mg total) by mouth 2 (two) times daily. 06/03/20   Henderly, Britni A, PA-C  ondansetron (ZOFRAN ODT) 4 MG disintegrating tablet Take 1 tablet (4 mg total) by mouth every 6 (six) hours as needed for nausea or vomiting. 05/30/19   Ward, Layla Maw, DO  oxyCODONE-acetaminophen (PERCOCET/ROXICET) 5-325 MG tablet Take 2 tablets by mouth every 6 (six) hours as needed for severe pain. 05/30/19   Ward, Layla Maw, DO      Allergies    Patient has no known allergies.    Review of Systems   Review of Systems  Musculoskeletal:  Positive for back pain.    Physical Exam Updated Vital Signs BP (!) 178/84 (BP Location: Left Arm)   Pulse 90   Temp 99.3 F (37.4 C) (Oral)   Resp 18   Ht 6\' 2"  (1.88 m)   Wt 85.7 kg   SpO2 99%   BMI 24.27 kg/m  Physical Exam Vitals and nursing note reviewed. Exam conducted with a chaperone present (Male nurse tech present as chaperone).  Constitutional:      General: He is not in acute distress.    Appearance: He is not ill-appearing, toxic-appearing or diaphoretic.  HENT:     Head: Normocephalic.  Eyes:     General: No scleral icterus.       Right eye: No discharge.        Left eye: No discharge.  Cardiovascular:     Rate  and Rhythm: Normal rate.  Pulmonary:     Effort: Pulmonary effort is normal.  Abdominal:     General: Abdomen is flat. There is no distension. There are no signs of injury.     Palpations: Abdomen is soft. There is no mass or pulsatile mass.     Tenderness: There is no abdominal tenderness. There is no guarding or rebound.     Hernia: There is no hernia in the left inguinal area or right inguinal area.  Genitourinary:    Pubic Area: No rash or pubic lice.      Penis: Circumcised. No hypospadias, erythema, tenderness, discharge, swelling or lesions.      Testes: Normal. Cremasteric reflex is present.     Epididymis:     Right: Normal.     Left: Normal.     Tanner stage  (genital): 5.  Musculoskeletal:     Cervical back: No swelling, edema, deformity, erythema, signs of trauma, lacerations, rigidity, spasms, torticollis, tenderness, bony tenderness or crepitus. No pain with movement. Normal range of motion.     Thoracic back: No swelling, edema, deformity, signs of trauma, lacerations, spasms, tenderness or bony tenderness.     Lumbar back: Tenderness present. No swelling, edema, deformity, signs of trauma, lacerations, spasms or bony tenderness. Negative right straight leg raise test and negative left straight leg raise test.     Comments: No midline tenderness or deformity to cervical, thoracic, or lumbar spine.  Patient does have diffuse tenderness to bilateral lumbar back  Lymphadenopathy:     Lower Body: No right inguinal adenopathy. No left inguinal adenopathy.  Skin:    General: Skin is warm and dry.  Neurological:     General: No focal deficit present.     Mental Status: He is alert.  Psychiatric:        Behavior: Behavior is cooperative.     ED Results / Procedures / Treatments   Labs (all labs ordered are listed, but only abnormal results are displayed) Labs Reviewed  URINALYSIS, ROUTINE W REFLEX MICROSCOPIC  RPR  HIV ANTIBODY (ROUTINE TESTING W REFLEX)  GC/CHLAMYDIA PROBE AMP ([place]) NOT AT [facility]    EKG None  Radiology No results found.  Procedures Procedures    Medications Ordered in ED Medications  ketorolac (TORADOL) 15 MG/ML injection 15 mg (has no administration in time range)  penicillin g benzathine (BICILLIN LA) 1200000 UNIT/2ML injection 2.4 Million Units (has no administration in time range)    ED Course/ Medical Decision Making/ A&P                           Medical Decision Making Amount and/or Complexity of Data Reviewed Labs: ordered.  Risk Prescription drug management.   Alert 41 year old male in no acute distress, nontoxic-appearing.  Presents to the ED with a chief complaint of syphilis exposure  and acute back pain.  Information was obtained from patient.  I reviewed patient's past medical records including previous notes, labs, and imaging.  Patient reports exposure to syphilis.  We will treat with penicillin in the emergency department.  We will add on testing for HIV, syphilis, gonorrhea and chlamydia.  Discussed abstaining from sexual activity for 2 weeks.  Discussed following up for treatment if test results are positive.  Patient has history of lumbar back pain.  Patient reports that he has had pain there for the last week.  Patient does work as a Educational psychologist.  Tenderness  to bilateral lumbar back.  No radicular symptoms.  No red flag symptoms.  We will treat patient for musculoskeletal strain.  Patient to receive lidocaine patch and Toradol injection in the emergency department.  Will send home patient with course of muscle relaxer and short course of Percocet pain medication.  Patient given information follow-up with orthopedic provider  Based on patient's chief complaint, I considered admission might be necessary, however after reassuring ED workup feel patient is reasonable for discharge.  Discussed results, findings, treatment and follow up. Patient advised of return precautions. Patient verbalized understanding and agreed with plan.  Portions of this note were generated with Scientist, clinical (histocompatibility and immunogenetics). Dictation errors may occur despite best attempts at proofreading.         Final Clinical Impression(s) / ED Diagnoses Final diagnoses:  Acute bilateral low back pain without sciatica  Exposure to STD    Rx / DC Orders ED Discharge Orders          Ordered    oxyCODONE-acetaminophen (PERCOCET/ROXICET) 5-325 MG tablet  Every 8 hours PRN        10/20/21 1742    methocarbamol (ROBAXIN) 500 MG tablet  Every 8 hours PRN        10/20/21 1742              Haskel Schroeder, PA-C 10/20/21 1910    Franne Forts, DO 10/22/21 0708

## 2021-10-20 NOTE — ED Triage Notes (Signed)
C/O back pain x 1 week; endorsed a partner tested + for STD (syphilis) and seeks treatment.

## 2021-10-20 NOTE — Discharge Instructions (Addendum)
You also have tests pending for gonorrhea, chlamydia, HIV, and syphilis.  You should receive a call if these results are positive.  On your discharge information there is also instructions on how to sign up for my chart.  Please complete this so that you can follow the results on your own also.  If positive for HIV or gonorrhea please got to [facility] department, your primary care doctor, or urgent care to start treatment.  If you are positive for syphilis you do not need to seek treatment as you have been treated with this medication.  Please do not have any sexual activity for the next two weeks.  After that please make sure that you always use a condom every time you have sex.  If you have any new or concerning symptoms please seek additional medical care and evaluation.     You also complaining of low back pain.  Your physical exam was reassuring.  Please take the Percocet and Robaxin as prescribed.  Additionally you may take Tylenol and ibuprofen as indicated below.  Please follow-up with the orthopedic doctor listed on this paperwork for further evaluation.  Today you were prescribed Methocarbamol (Robaxin).  Methocarbamol (Robaxin) is used to treat muscle spasms/pain.  It works by helping to relax the muscles.  Drowsiness, dizziness, lightheadedness, stomach upset, nausea/vomiting, or blurred vision may occur.  Do not drive, use machinery, or do anything that needs alertness or clear vision until you can do it safely.  Do not combine this medication with alcoholic beverages, marijuana, or other central nervous system depressants.   You are being prescribed a medication which may make you sleepy or mpair your ability to make decisions.  For 24 hours after taking this medication please do not drive, operate heavy machinery, care for a small child with out another adult present, or perform any activities that may cause harm to you or someone else if you were to fall asleep or be impaired.    Get help right away if: You develop new bowel or bladder control problems. You have unusual weakness or numbness in your arms or legs. You feel faint.

## 2021-10-21 LAB — GC/CHLAMYDIA PROBE AMP (~~LOC~~) NOT AT ARMC
Chlamydia: NEGATIVE
Comment: NEGATIVE
Comment: NORMAL
Neisseria Gonorrhea: NEGATIVE

## 2021-10-21 LAB — HIV ANTIBODY (ROUTINE TESTING W REFLEX): HIV Screen 4th Generation wRfx: NONREACTIVE

## 2021-10-21 LAB — RPR: RPR Ser Ql: NONREACTIVE

## 2023-03-24 ENCOUNTER — Other Ambulatory Visit: Payer: Self-pay

## 2023-03-24 ENCOUNTER — Observation Stay (HOSPITAL_COMMUNITY): Payer: Medicaid Other

## 2023-03-24 ENCOUNTER — Emergency Department (HOSPITAL_COMMUNITY): Payer: Medicaid Other

## 2023-03-24 ENCOUNTER — Inpatient Hospital Stay (HOSPITAL_COMMUNITY): Payer: Medicaid Other | Admitting: Anesthesiology

## 2023-03-24 ENCOUNTER — Observation Stay (HOSPITAL_COMMUNITY)
Admission: EM | Admit: 2023-03-24 | Discharge: 2023-03-25 | Disposition: A | Discharge status: Home / Self Care | Payer: Medicaid Other | Attending: Orthopaedic Surgery | Admitting: Orthopaedic Surgery

## 2023-03-24 ENCOUNTER — Encounter (HOSPITAL_COMMUNITY): Payer: Self-pay | Admitting: Orthopaedic Surgery

## 2023-03-24 ENCOUNTER — Encounter (HOSPITAL_COMMUNITY)
Admission: EM | Disposition: A | Discharge status: Home / Self Care | Payer: Self-pay | Source: Home / Self Care | Attending: Orthopaedic Surgery

## 2023-03-24 ENCOUNTER — Inpatient Hospital Stay (HOSPITAL_BASED_OUTPATIENT_CLINIC_OR_DEPARTMENT_OTHER): Payer: Medicaid Other | Admitting: Anesthesiology

## 2023-03-24 ENCOUNTER — Inpatient Hospital Stay (HOSPITAL_COMMUNITY): Payer: Medicaid Other

## 2023-03-24 DIAGNOSIS — S82202B Unspecified fracture of shaft of left tibia, initial encounter for open fracture type I or II: Secondary | ICD-10-CM

## 2023-03-24 DIAGNOSIS — S82309B Unspecified fracture of lower end of unspecified tibia, initial encounter for open fracture type I or II: Secondary | ICD-10-CM | POA: Diagnosis present

## 2023-03-24 DIAGNOSIS — S82832B Other fracture of upper and lower end of left fibula, initial encounter for open fracture type I or II: Secondary | ICD-10-CM | POA: Diagnosis not present

## 2023-03-24 DIAGNOSIS — S82872B Displaced pilon fracture of left tibia, initial encounter for open fracture type I or II: Secondary | ICD-10-CM | POA: Diagnosis present

## 2023-03-24 DIAGNOSIS — S82892B Other fracture of left lower leg, initial encounter for open fracture type I or II: Secondary | ICD-10-CM | POA: Diagnosis present

## 2023-03-24 DIAGNOSIS — Z23 Encounter for immunization: Secondary | ICD-10-CM | POA: Diagnosis not present

## 2023-03-24 DIAGNOSIS — W11XXXA Fall on and from ladder, initial encounter: Secondary | ICD-10-CM | POA: Diagnosis not present

## 2023-03-24 DIAGNOSIS — J45909 Unspecified asthma, uncomplicated: Secondary | ICD-10-CM | POA: Insufficient documentation

## 2023-03-24 DIAGNOSIS — S82202C Unspecified fracture of shaft of left tibia, initial encounter for open fracture type IIIA, IIIB, or IIIC: Secondary | ICD-10-CM

## 2023-03-24 DIAGNOSIS — S82839B Other fracture of upper and lower end of unspecified fibula, initial encounter for open fracture type I or II: Secondary | ICD-10-CM | POA: Diagnosis present

## 2023-03-24 DIAGNOSIS — F17210 Nicotine dependence, cigarettes, uncomplicated: Secondary | ICD-10-CM | POA: Insufficient documentation

## 2023-03-24 HISTORY — PX: EXTERNAL FIXATION LEG: SHX1549

## 2023-03-24 LAB — COMPREHENSIVE METABOLIC PANEL
ALT: 11 U/L (ref 0–44)
AST: 20 U/L (ref 15–41)
Albumin: 3.1 g/dL — ABNORMAL LOW (ref 3.5–5.0)
Alkaline Phosphatase: 59 U/L (ref 38–126)
Anion gap: 8 (ref 5–15)
BUN: 10 mg/dL (ref 6–20)
CO2: 22 mmol/L (ref 22–32)
Calcium: 8.6 mg/dL — ABNORMAL LOW (ref 8.9–10.3)
Chloride: 108 mmol/L (ref 98–111)
Creatinine, Ser: 1.04 mg/dL (ref 0.61–1.24)
GFR, Estimated: >60 mL/min (ref >60)
Glucose, Bld: 145 mg/dL — ABNORMAL HIGH (ref 70–99)
Potassium: 4.3 mmol/L (ref 3.5–5.1)
Sodium: 138 mmol/L (ref 135–145)
Total Bilirubin: 0.3 mg/dL (ref <1.2)
Total Protein: 5.7 g/dL — ABNORMAL LOW (ref 6.5–8.1)

## 2023-03-24 LAB — CBC
HCT: 43.9 % (ref 39.0–52.0)
HCT: 38.6 % — ABNORMAL LOW (ref 39.0–52.0)
Hemoglobin: 14.3 g/dL (ref 13.0–17.0)
Hemoglobin: 12.8 g/dL — ABNORMAL LOW (ref 13.0–17.0)
MCH: 28.8 pg (ref 26.0–34.0)
MCH: 28.7 pg (ref 26.0–34.0)
MCHC: 32.6 g/dL (ref 30.0–36.0)
MCHC: 33.2 g/dL (ref 30.0–36.0)
MCV: 88.3 fL (ref 80.0–100.0)
MCV: 86.5 fL (ref 80.0–100.0)
Platelets: 229 10*3/uL (ref 150–400)
Platelets: 239 10*3/uL (ref 150–400)
RBC: 4.97 MIL/uL (ref 4.22–5.81)
RBC: 4.46 MIL/uL (ref 4.22–5.81)
RDW: 14.7 % (ref 11.5–15.5)
RDW: 14.6 % (ref 11.5–15.5)
WBC: 8.1 10*3/uL (ref 4.0–10.5)
WBC: 11.7 10*3/uL — ABNORMAL HIGH (ref 4.0–10.5)
nRBC: 0 % (ref 0.0–0.2)
nRBC: 0 % (ref 0.0–0.2)

## 2023-03-24 LAB — SURGICAL PCR SCREEN

## 2023-03-24 LAB — I-STAT CHEM 8, ED
BUN: 12 mg/dL (ref 6–20)
Calcium, Ion: 1.04 mmol/L — ABNORMAL LOW (ref 1.15–1.40)
Chloride: 107 mmol/L (ref 98–111)
Creatinine, Ser: 1 mg/dL (ref 0.61–1.24)
Glucose, Bld: 143 mg/dL — ABNORMAL HIGH (ref 70–99)
HCT: 43 % (ref 39.0–52.0)
Hemoglobin: 14.6 g/dL (ref 13.0–17.0)
Potassium: 4.4 mmol/L (ref 3.5–5.1)
Sodium: 139 mmol/L (ref 135–145)
TCO2: 24 mmol/L (ref 22–32)

## 2023-03-24 LAB — CREATININE, SERUM
Creatinine, Ser: 1.24 mg/dL (ref 0.61–1.24)
GFR, Estimated: >60 mL/min (ref >60)

## 2023-03-24 LAB — ETHANOL: Alcohol, Ethyl (B): <10 mg/dL (ref <10)

## 2023-03-24 LAB — PROTIME-INR
INR: 1 (ref 0.8–1.2)
Prothrombin Time: 13.6 s (ref 11.4–15.2)

## 2023-03-24 LAB — SAMPLE TO BLOOD BANK

## 2023-03-24 LAB — I-STAT CG4 LACTIC ACID, ED: Lactic Acid, Venous: 1.7 mmol/L (ref 0.5–1.9)

## 2023-03-24 SURGERY — EXTERNAL FIXATION, LOWER EXTREMITY
Anesthesia: General | Site: Ankle | Laterality: Left

## 2023-03-24 MED ORDER — TRANEXAMIC ACID-NACL 1000-0.7 MG/100ML-% IV SOLN
INTRAVENOUS | Status: AC
  Filled 2023-03-24: qty 100

## 2023-03-24 MED ORDER — ONDANSETRON HCL 4 MG/2ML IJ SOLN: 4.0000 mg | Freq: Four times a day (QID) | INTRAMUSCULAR | Status: DC | PRN

## 2023-03-24 MED ORDER — ONDANSETRON HCL 4 MG PO TABS: 4.0000 mg | ORAL_TABLET | Freq: Four times a day (QID) | ORAL | Status: DC | PRN

## 2023-03-24 MED ORDER — ONDANSETRON HCL 4 MG/2ML IJ SOLN
INTRAMUSCULAR | Status: DC | PRN
  Administered 2023-03-24: 4 mg via INTRAVENOUS

## 2023-03-24 MED ORDER — DIPHENHYDRAMINE HCL 12.5 MG/5ML PO ELIX: 12.5000 mg | ORAL_SOLUTION | ORAL | Status: DC | PRN

## 2023-03-24 MED ORDER — DEXAMETHASONE SODIUM PHOSPHATE 10 MG/ML IJ SOLN
INTRAMUSCULAR | Status: DC | PRN
  Administered 2023-03-24: 10 mg via INTRAVENOUS

## 2023-03-24 MED ORDER — SCOPOLAMINE 1 MG/3DAYS TD PT72
MEDICATED_PATCH | TRANSDERMAL | Status: DC | PRN
  Administered 2023-03-24: 1 via TRANSDERMAL

## 2023-03-24 MED ORDER — MIDAZOLAM HCL 2 MG/2ML IJ SOLN
INTRAMUSCULAR | Status: DC | PRN
  Administered 2023-03-24: 2 mg via INTRAVENOUS

## 2023-03-24 MED ORDER — OXYCODONE HCL 5 MG PO TABS: 10.0000 mg | ORAL_TABLET | ORAL | Status: DC | PRN

## 2023-03-24 MED ORDER — LIDOCAINE 2% (20 MG/ML) 5 ML SYRINGE
INTRAMUSCULAR | Status: DC | PRN
  Administered 2023-03-24: 100 mg via INTRAVENOUS

## 2023-03-24 MED ORDER — PROPOFOL 10 MG/ML IV BOLUS
INTRAVENOUS | Status: DC | PRN
  Administered 2023-03-24: 30 mg via INTRAVENOUS
  Administered 2023-03-24: 50 mg via INTRAVENOUS
  Administered 2023-03-24: 200 mg via INTRAVENOUS

## 2023-03-24 MED ORDER — DROPERIDOL 2.5 MG/ML IJ SOLN: 0.6250 mg | Freq: Once | INTRAMUSCULAR | Status: DC | PRN

## 2023-03-24 MED ORDER — ACETAMINOPHEN 500 MG PO TABS
1000.0000 mg | ORAL_TABLET | Freq: Four times a day (QID) | ORAL | Status: DC
  Filled 2023-03-24: qty 2

## 2023-03-24 MED ORDER — KETOROLAC TROMETHAMINE 30 MG/ML IJ SOLN
INTRAMUSCULAR | Status: DC | PRN
  Administered 2023-03-24: 30 mg via INTRAVENOUS

## 2023-03-24 MED ORDER — TRANEXAMIC ACID-NACL 1000-0.7 MG/100ML-% IV SOLN
1000.0000 mg | INTRAVENOUS | Status: AC
  Administered 2023-03-24: 1000 mg via INTRAVENOUS

## 2023-03-24 MED ORDER — ENOXAPARIN SODIUM 40 MG/0.4ML IJ SOSY: 40.0000 mg | PREFILLED_SYRINGE | INTRAMUSCULAR | Status: DC

## 2023-03-24 MED ORDER — SODIUM CHLORIDE 0.9 % IV SOLN: INTRAVENOUS | Status: DC | PRN

## 2023-03-24 MED ORDER — METHOCARBAMOL 500 MG PO TABS
500.0000 mg | ORAL_TABLET | Freq: Four times a day (QID) | ORAL | Status: DC | PRN
Start: 2023-03-24 — End: 2023-03-24

## 2023-03-24 MED ORDER — PROPOFOL 500 MG/50ML IV EMUL
INTRAVENOUS | Status: DC | PRN
  Administered 2023-03-24: 50 ug/kg/min via INTRAVENOUS

## 2023-03-24 MED ORDER — ACETAMINOPHEN 500 MG PO TABS
1000.0000 mg | ORAL_TABLET | Freq: Once | ORAL | Status: AC
Start: 2023-03-24 — End: 2023-03-24
  Administered 2023-03-24: 1000 mg via ORAL
  Filled 2023-03-24: qty 2

## 2023-03-24 MED ORDER — NAPROXEN 250 MG PO TABS
250.0000 mg | ORAL_TABLET | Freq: Two times a day (BID) | ORAL | Status: DC
  Administered 2023-03-25: 250 mg via ORAL
  Filled 2023-03-24: qty 1

## 2023-03-24 MED ORDER — HYDROMORPHONE HCL 1 MG/ML IJ SOLN
INTRAMUSCULAR | Status: DC | PRN
  Administered 2023-03-24: .5 mg via INTRAVENOUS

## 2023-03-24 MED ORDER — FENTANYL CITRATE (PF) 250 MCG/5ML IJ SOLN
INTRAMUSCULAR | Status: AC
  Filled 2023-03-24: qty 5

## 2023-03-24 MED ORDER — METOCLOPRAMIDE HCL 5 MG PO TABS: 5.0000 mg | ORAL_TABLET | Freq: Three times a day (TID) | ORAL | Status: DC | PRN

## 2023-03-24 MED ORDER — MIDAZOLAM HCL 2 MG/2ML IJ SOLN
INTRAMUSCULAR | Status: AC
  Filled 2023-03-24: qty 2

## 2023-03-24 MED ORDER — ACETAMINOPHEN 325 MG PO TABS
325.0000 mg | ORAL_TABLET | Freq: Four times a day (QID) | ORAL | Status: DC | PRN
Start: 2023-03-25 — End: 2023-03-25

## 2023-03-24 MED ORDER — POVIDONE-IODINE 10 % EX SWAB
2.0000 | Freq: Once | CUTANEOUS | Status: AC
  Administered 2023-03-24: 2 via TOPICAL

## 2023-03-24 MED ORDER — OXYCODONE HCL 5 MG PO TABS: 5.0000 mg | ORAL_TABLET | ORAL | Status: DC | PRN

## 2023-03-24 MED ORDER — BUPIVACAINE HCL (PF) 0.5 % IJ SOLN
INTRAMUSCULAR | Status: AC
  Filled 2023-03-24: qty 30

## 2023-03-24 MED ORDER — CHLORHEXIDINE GLUCONATE 4 % EX SOLN: 60.0000 mL | Freq: Once | CUTANEOUS | Status: DC

## 2023-03-24 MED ORDER — HYDROMORPHONE HCL 1 MG/ML IJ SOLN: 0.5000 mg | INTRAMUSCULAR | Status: DC | PRN

## 2023-03-24 MED ORDER — DEXMEDETOMIDINE HCL IN NACL 80 MCG/20ML IV SOLN
INTRAVENOUS | Status: DC | PRN
  Administered 2023-03-24: 12 ug via INTRAVENOUS

## 2023-03-24 MED ORDER — ENOXAPARIN SODIUM 40 MG/0.4ML IJ SOSY
40.0000 mg | PREFILLED_SYRINGE | INTRAMUSCULAR | Status: DC
  Administered 2023-03-25: 40 mg via SUBCUTANEOUS
  Filled 2023-03-24: qty 0.4

## 2023-03-24 MED ORDER — SODIUM CHLORIDE 0.9 % IR SOLN
Status: DC | PRN
  Administered 2023-03-24: 6000 mL

## 2023-03-24 MED ORDER — MORPHINE SULFATE (PF) 4 MG/ML IV SOLN
4.0000 mg | Freq: Once | INTRAVENOUS | Status: AC
  Administered 2023-03-24: 4 mg via INTRAVENOUS

## 2023-03-24 MED ORDER — 0.9 % SODIUM CHLORIDE (POUR BTL) OPTIME
TOPICAL | Status: DC | PRN
  Administered 2023-03-24: 1000 mL

## 2023-03-24 MED ORDER — ONDANSETRON HCL 4 MG PO TABS
4.0000 mg | ORAL_TABLET | Freq: Four times a day (QID) | ORAL | Status: DC | PRN
Start: 2023-03-24 — End: 2023-03-24

## 2023-03-24 MED ORDER — MORPHINE SULFATE (PF) 2 MG/ML IV SOLN
2.0000 mg | INTRAVENOUS | Status: DC | PRN
  Administered 2023-03-24: 2 mg via INTRAVENOUS
  Filled 2023-03-24: qty 1

## 2023-03-24 MED ORDER — CEFAZOLIN SODIUM-DEXTROSE 2-4 GM/100ML-% IV SOLN
INTRAVENOUS | Status: AC
  Filled 2023-03-24: qty 100

## 2023-03-24 MED ORDER — HYDROMORPHONE HCL 1 MG/ML IJ SOLN
INTRAMUSCULAR | Status: AC
  Filled 2023-03-24: qty 0.5

## 2023-03-24 MED ORDER — FENTANYL CITRATE (PF) 250 MCG/5ML IJ SOLN
INTRAMUSCULAR | Status: DC | PRN
  Administered 2023-03-24: 100 ug via INTRAVENOUS
  Administered 2023-03-24: 50 ug via INTRAVENOUS

## 2023-03-24 MED ORDER — HYDROMORPHONE HCL 1 MG/ML IJ SOLN
0.2500 mg | INTRAMUSCULAR | Status: DC | PRN
  Administered 2023-03-24: 0.5 mg via INTRAVENOUS

## 2023-03-24 MED ORDER — TETANUS-DIPHTH-ACELL PERTUSSIS 5-2.5-18.5 LF-MCG/0.5 IM SUSY
0.5000 mL | PREFILLED_SYRINGE | Freq: Once | INTRAMUSCULAR | Status: AC
  Administered 2023-03-24: 0.5 mL via INTRAMUSCULAR

## 2023-03-24 MED ORDER — METHOCARBAMOL 1000 MG/10ML IJ SOLN
500.0000 mg | Freq: Four times a day (QID) | INTRAMUSCULAR | Status: DC | PRN
Start: 2023-03-24 — End: 2023-03-24

## 2023-03-24 MED ORDER — MORPHINE SULFATE (PF) 2 MG/ML IV SOLN
INTRAVENOUS | Status: AC
  Filled 2023-03-24: qty 2

## 2023-03-24 MED ORDER — CEFAZOLIN SODIUM-DEXTROSE 2-4 GM/100ML-% IV SOLN
2.0000 g | INTRAVENOUS | Status: AC
Start: 2023-03-25 — End: 2023-03-24
  Administered 2023-03-24: 2 g via INTRAVENOUS

## 2023-03-24 MED ORDER — PROPOFOL 10 MG/ML IV BOLUS
INTRAVENOUS | Status: AC
  Filled 2023-03-24: qty 20

## 2023-03-24 MED ORDER — DIPHENHYDRAMINE HCL 50 MG/ML IJ SOLN
INTRAMUSCULAR | Status: DC | PRN
  Administered 2023-03-24: 12.5 mg via INTRAVENOUS

## 2023-03-24 MED ORDER — DOCUSATE SODIUM 100 MG PO CAPS
100.0000 mg | ORAL_CAPSULE | Freq: Two times a day (BID) | ORAL | Status: DC
  Filled 2023-03-24: qty 1

## 2023-03-24 MED ORDER — INFLUENZA VAC A&B SURF ANT ADJ 0.5 ML IM SUSY
0.5000 mL | PREFILLED_SYRINGE | INTRAMUSCULAR | Status: DC
Start: 2023-03-25 — End: 2023-03-24

## 2023-03-24 MED ORDER — METOCLOPRAMIDE HCL 5 MG/ML IJ SOLN: 5.0000 mg | Freq: Three times a day (TID) | INTRAMUSCULAR | Status: DC | PRN

## 2023-03-24 MED ORDER — HYDROMORPHONE HCL 1 MG/ML IJ SOLN
INTRAMUSCULAR | Status: AC
  Filled 2023-03-24: qty 1

## 2023-03-24 SURGICAL SUPPLY — 42 items
BAR GLASS FIBER EXFX 11X500 (EXFIX) IMPLANT
BNDG COHESIVE 4X5 TAN STRL LF (GAUZE/BANDAGES/DRESSINGS) IMPLANT
BNDG COHESIVE 6X5 TAN ST LF (GAUZE/BANDAGES/DRESSINGS) IMPLANT
BNDG ELASTIC 4INX 5YD STR LF (GAUZE/BANDAGES/DRESSINGS) IMPLANT
BNDG GAUZE DERMACEA FLUFF 4 (GAUZE/BANDAGES/DRESSINGS) ×1 IMPLANT
BNDG STRETCH GAUZE 3IN X12FT (GAUZE/BANDAGES/DRESSINGS) ×1 IMPLANT
CHLORAPREP W/TINT 26 (MISCELLANEOUS) ×1 IMPLANT
CLAMP BLUE BAR TO PIN (EXFIX) IMPLANT
DRAPE C-ARM 42X72 X-RAY (DRAPES) IMPLANT
DRAPE C-ARMOR (DRAPES) ×1 IMPLANT
ELECT REM PT RETURN 9FT ADLT (ELECTROSURGICAL) ×1 IMPLANT
ELECTRODE REM PT RTRN 9FT ADLT (ELECTROSURGICAL) ×1 IMPLANT
GAUZE SPONGE 4X4 12PLY STRL (GAUZE/BANDAGES/DRESSINGS) ×1 IMPLANT
GAUZE STRETCH 2X75IN STRL (MISCELLANEOUS) ×1 IMPLANT
GAUZE XEROFORM 1X8 LF (GAUZE/BANDAGES/DRESSINGS) ×1 IMPLANT
GAUZE XEROFORM 5X9 LF (GAUZE/BANDAGES/DRESSINGS) IMPLANT
GLOVE BIO SURGEON STRL SZ7.5 (GLOVE) ×1 IMPLANT
GLOVE BIOGEL PI IND STRL 7.5 (GLOVE) ×1 IMPLANT
GOWN STRL REUS W/ TWL LRG LVL3 (GOWN DISPOSABLE) ×1 IMPLANT
HALF PIN 5.0X160 (EXFIX) IMPLANT
KIT TURNOVER KIT A (KITS) ×1 IMPLANT
LABEL OR SOLS (LABEL) ×1 IMPLANT
MANIFOLD NEPTUNE II (INSTRUMENTS) ×1 IMPLANT
NDL FILTER BLUNT 18X1 1/2 (NEEDLE) ×1 IMPLANT
NDL HYPO 25X1 1.5 SAFETY (NEEDLE) ×1 IMPLANT
NEEDLE FILTER BLUNT 18X1 1/2 (NEEDLE) ×1 IMPLANT
NEEDLE HYPO 25X1 1.5 SAFETY (NEEDLE) ×1 IMPLANT
NS IRRIG 500ML POUR BTL (IV SOLUTION) ×1 IMPLANT
PACK EXTREMITY ARMC (MISCELLANEOUS) ×1 IMPLANT
PAD ABD DERMACEA PRESS 5X9 (GAUZE/BANDAGES/DRESSINGS) ×1 IMPLANT
PAD CAST 3X4 CTTN HI CHSV (CAST SUPPLIES) IMPLANT
PAD CAST 4YDX4 CTTN HI CHSV (CAST SUPPLIES) IMPLANT
PAD PREP OB/GYN DISP 24X41 (PERSONAL CARE ITEMS) ×1 IMPLANT
PIN CLAMP 2BAR 75MM BLUE (EXFIX) IMPLANT
PIN TRANSFIXING 5.0 (EXFIX) IMPLANT
STOCKINETTE IMPERVIOUS 9X36 MD (GAUZE/BANDAGES/DRESSINGS) IMPLANT
SUT ETHILON 2 0 FS 18 (SUTURE) ×1 IMPLANT
SUT ETHILON 4-0 FS2 18XMFL BLK (SUTURE) ×1 IMPLANT
SUTURE ETHLN 4-0 FS2 18XMF BLK (SUTURE) ×1 IMPLANT
SYR 10ML LL (SYRINGE) ×1 IMPLANT
TRAP FLUID SMOKE EVACUATOR (MISCELLANEOUS) ×1 IMPLANT
WATER STERILE IRR 500ML POUR (IV SOLUTION) ×1 IMPLANT

## 2023-03-24 NOTE — ED Notes (Signed)
Patient transported to CT 

## 2023-03-24 NOTE — Progress Notes (Signed)
I responded to page to  provide support to pt that fell from Ladder. Pt gone to CT.  Chaplain available as needed.  Venida Jarvis, [place], [facility], Pager [phone]

## 2023-03-24 NOTE — ED Triage Notes (Signed)
Pt to ED via [facility], fall off ladder 20 ft, landed on grass and ladder. No LOC, did not hit head. Not on blood thinner. Open left tib fib fracture .  Pt A&O X 4, GCS 15/   Last VS: 130/80, P 90. RR16, 97%RA.   #18 LHAND- Fentanyl, 2gram Ancef given by EMS.

## 2023-03-24 NOTE — ED Notes (Signed)
Trauma Response Nurse Documentation  Thomas [name] is a 42 y.o. male arriving to [facility] ED via EMS  Trauma was activated as a Level 2 based on the following trauma criteria Falls > 20 ft. with adults, >10 ft. with children (<15).   GCS 15.  History   Past Medical History:  Diagnosis Date   Allergy    Asthma    Low back pain      Past Surgical History:  Procedure Laterality Date   HAND SURGERY Right      Initial Focused Assessment (If applicable, or please see trauma documentation): Patient A&Ox4, GCS 15, PERR 3  CT's Completed:   CT Head and CT C-Spine   Interventions:  IV, labs CXR/PXR CTs Tdap  Plan for disposition:  OR   Consults completed:  Orthopaedic Surgeon at see chart.  Event Summary: Patient to ED after falling while cutting a limb at work (works Aeronautical engineer). Patient with obvious open ankle fx. Other imaging negative for traumatic injury. Plans for OR for washout with Dr Susa Simmonds.   Bedside handoff with ED RN Morrie Sheldon.    Thomas [name]  Trauma Response RN  Please call TRN at [phone] for further assistance.

## 2023-03-24 NOTE — Op Note (Addendum)
COHAN YOUNGERMAN male 42 y.o. 03/24/2023  PreOperative Diagnosis: Left open pilon ankle fracture with associated fibula fracture 10 cm traumatic wound about the medial ankle at site of open fracture  PostOperative Diagnosis: Same  PROCEDURE: Irrigation debridement left ankle open fracture Closed reduction of intra-articular distal tibia fracture Placement of external fixator Complex closure of traumatic wound, 10 cm  SURGEON: Dub Mikes, MD  ASSISTANT: Jesse [place], PA-C was necessary for patient positioning, prep, drape, assistance with fracture reduction and placement of external fixator  ANESTHESIA: General  FINDINGS: See below  IMPLANTS: [employer] large external fixator  INDICATIONS:42 y.o. male sustained the above injury after a fall from a ladder.  He got his leg hung up in the ladder and sustained an open fracture to his left ankle region including the distal tibia with significant impaction about the plafond and and associated fibular fracture.  There was a large wound along the medial aspect of the distal leg.  The proximal fracture fragments were extruding from the wound.  He presented to the emergency department where x-rays revealed the injury and orthopedics was consulted.  He was taken urgently to the operative suite in order to undergo irrigation debridement with close reduction of his fracture and placement of external fixator.   Patient understood the risks, benefits and alternatives to surgery which include but are not limited to wound healing complications, infection, nonunion, malunion, need for further surgery as well as damage to surrounding structures. They also understood the potential for continued pain in that there were no guarantees of acceptable outcome After weighing these risks the patient opted to proceed with surgery.   PROCEDURE: Patient was identified in the preoperative holding area.  The left leg was marked by myself.  Consent was  signed by myself and the patient.  Patient was taken to the operative suite and placed supine on the operative table.  General anesthesia was induced without difficulty. Bump was placed under the operative hip and bone foam was used.  All bony prominences were well padded.  Preoperative antibiotics were given. The extremity was prepped and draped in the usual sterile fashion and surgical timeout was performed.    I began by inspecting the injury site.  There was a large 10 cm jagged wound with exposed proximal tibia sticking out of the wound approximately 2 to 3 cm.  There was concern for posterior medial tenderness and neurovascular structure injury.  I then proceeded to extend the wound on the more anterior portion of the wound distally and the more posterior portion of the wound more proximally.  We are able to flap it open and gain access to the fracture and structures.  The fracture was significantly displaced and there was some evidence of contamination of the portion of bone that was protruding from the skin edges.  Using a curette and a rondure were and irrigation fluid the irrigation debridement was performed.  Debridement type: Excisional Debridement  Side: left  Body Location: Leg/Ankle   Tools used for debridement: scalpel, scissors, curette, and rongeur  Pre-debridement Wound size (cm):   Length:10         Width: 3     Depth: 1   Post-debridement Wound size (cm):   Length: 10        Width: 30     Depth: 1   Debridement depth beyond dead/damaged tissue down to healthy viable tissue: yes  Tissue layer involved: skin, subcutaneous tissue, muscle / fascia, bone  Nature of tissue  removed: Devitalized Tissue  Irrigation volume: 6000cc     Irrigation fluid type: Normal Saline  During the debridement significant amount of chunks of cancellous bone from the intramedullary canal of the tibia came out with the irrigation fluid.  There was some contamination of the bone that was removed  using a curette and rondure.  After acceptable irrigation and debridement we proceeded to perform closed reduction with external fixator placement.  Using a 10 blade a stab incision was made overlying the anteromedial border of the tibia and a external fixator pin was placed.  A second pin was placed using the guide.  Then they were checked fluoroscopically to ensure appropriate pin length.  Then the clamp was placed proximally and we went distal to the calcaneus.  Stab incision was made overlying the lateral border of the calcaneus and a calcaneal transfixion pin was placed uneventfully.  Then pin to bar clamps were placed and finger tightened.  Then using traction and manipulation the fracture was reduced in a closed fashion.  Fluoroscopy confirmed acceptable reduction of the fracture sites.  There was some tipping of the tibiotalar joint due to likely to disruption of ligamentotaxis from the fibula to the talus.  Once acceptable reduction was confirmed the external fixator bolts were tightened maximally.  We then confirmed maintenance of reduction fluoroscopically.  We then proceeded to wash the wound with more normal saline and then proceeded with complex closure of the wound using 2-0 nylon suture.  The wound was quite jagged but there was good tension-free closure afterwards.  Patient had a palpable dorsalis pedis pulse and a biphasic Doppler signal at posterior tibialis artery distal to the zone of injury.  Then Xeroform was placed on the wounds and at the pin sites.  Then Kerlix was placed in soft dressing.  He was awakened from anesthesia and taken to recovery in stable condition.  No complications.    POST OPERATIVE INSTRUCTIONS: Nonweightbearing operative extremity Keep dressing in place CT scan ordered. DVT Prohylaxis  TOURNIQUET TIME: No tourniquet  BLOOD LOSS:  less than 50 mL         DRAINS: none         SPECIMEN: none       COMPLICATIONS:  * No complications entered  in OR log *         Disposition: PACU - hemodynamically stable.         Condition: stable

## 2023-03-24 NOTE — Consult Note (Signed)
Reason for Consult:Open left ankle fx Referring Physician: Elayne Snare Time called: 1155 Time at bedside: 1240   Thomas [name] is an 42 y.o. male.  HPI: Buddy was doing some tree work today when he fell from a ladder about 20 feet. He hit the ground with his feet and his left foot got caught in a ladder rung. He had immediate pain and bone coming through the skin. He was brought to the ED as a level 2 trauma activation. Workup showed only that injury and orthopedic surgery was consulted.   Past Medical History:  Diagnosis Date   Allergy    Asthma    Low back pain     Past Surgical History:  Procedure Laterality Date   HAND SURGERY Right     Family History  Problem Relation Age of Onset   Diabetes Mother     Social History:  reports that he has been smoking cigarettes. He has never used smokeless tobacco. He reports that he does not drink alcohol and does not use drugs.  Allergies: No Known Allergies  Medications: I have reviewed the patient's current medications.  Results for orders placed or performed during the hospital encounter of 03/24/23 (from the past 48 hours)  Comprehensive metabolic panel     Status: Abnormal   Collection Time: 03/24/23 11:51 AM  Result Value Ref Range   Sodium 138 135 - 145 mmol/L   Potassium 4.3 3.5 - 5.1 mmol/L   Chloride 108 98 - 111 mmol/L   CO2 22 22 - 32 mmol/L   Glucose, Bld 145 (H) 70 - 99 mg/dL    Comment: Glucose reference range applies only to samples taken after fasting for at least 8 hours.   BUN 10 6 - 20 mg/dL   Creatinine, Ser 1.19 0.61 - 1.24 mg/dL   Calcium 8.6 (L) 8.9 - 10.3 mg/dL   Total Protein 5.7 (L) 6.5 - 8.1 g/dL   Albumin 3.1 (L) 3.5 - 5.0 g/dL   AST 20 15 - 41 U/L   ALT 11 0 - 44 U/L   Alkaline Phosphatase 59 38 - 126 U/L   Total Bilirubin 0.3 <1.2 mg/dL   GFR, Estimated >14 >78 mL/min    Comment: (NOTE) Calculated using the CKD-EPI Creatinine Equation (2021)    Anion gap 8 5 - 15    Comment:  Performed at [facility], [address] [place], [place] [address]  CBC     Status: None   Collection Time: 03/24/23 11:51 AM  Result Value Ref Range   WBC 8.1 4.0 - 10.5 K/uL   RBC 4.97 4.22 - 5.81 MIL/uL   Hemoglobin 14.3 13.0 - 17.0 g/dL   HCT 13.0 86.5 - 78.4 %   MCV 88.3 80.0 - 100.0 fL   MCH 28.8 26.0 - 34.0 pg   MCHC 32.6 30.0 - 36.0 g/dL   RDW 69.6 29.5 - 28.4 %   Platelets 229 150 - 400 K/uL   nRBC 0.0 0.0 - 0.2 %    Comment: Performed at [facility], [address], [place], [place] [address]  Ethanol     Status: None   Collection Time: 03/24/23 11:51 AM  Result Value Ref Range   Alcohol, Ethyl (B) <10 <10 mg/dL    Comment: (NOTE) Lowest detectable limit for serum alcohol is 10 mg/dL.  For medical purposes only. Performed at [facility], [address], [place], [place] [address]   Protime-INR  Status: None   Collection Time: 03/24/23 11:51 AM  Result Value Ref Range   Prothrombin Time 13.6 11.4 - 15.2 seconds   INR 1.0 0.8 - 1.2    Comment: (NOTE) INR goal varies based on device and disease states. Performed at [facility], [address], [place], [place] [address]   Sample to Blood Bank     Status: None   Collection Time: 03/24/23 11:52 AM  Result Value Ref Range   Blood Bank Specimen SAMPLE AVAILABLE FOR TESTING    Sample Expiration      03/27/2023,2359 Performed at [facility], [address] [place], [place] [address]   I-Stat Chem 8, ED     Status: Abnormal   Collection Time: 03/24/23 12:00 PM  Result Value Ref Range   Sodium 139 135 - 145 mmol/L   Potassium 4.4 3.5 - 5.1 mmol/L   Chloride 107 98 - 111 mmol/L   BUN 12 6 - 20 mg/dL   Creatinine, Ser 5.64 0.61 - 1.24 mg/dL   Glucose, Bld 332 (H) 70 - 99 mg/dL    Comment: Glucose reference range applies only to samples taken after fasting for at least 8 hours.   Calcium, Ion 1.04 (L) 1.15 - 1.40 mmol/L   TCO2 24 22 - 32 mmol/L   Hemoglobin 14.6 13.0 - 17.0  g/dL   HCT 95.1 88.4 - 16.6 %  I-Stat Lactic Acid, ED     Status: None   Collection Time: 03/24/23 12:00 PM  Result Value Ref Range   Lactic Acid, Venous 1.7 0.5 - 1.9 mmol/L    No results found.  Review of Systems  HENT:  Negative for ear discharge, ear pain, hearing loss and tinnitus.   Eyes:  Negative for photophobia and pain.  Respiratory:  Negative for cough and shortness of breath.   Cardiovascular:  Negative for chest pain.  Gastrointestinal:  Negative for abdominal pain, nausea and vomiting.  Genitourinary:  Negative for dysuria, flank pain, frequency and urgency.  Musculoskeletal:  Positive for arthralgias (Left ankle). Negative for back pain, myalgias and neck pain.  Neurological:  Negative for dizziness and headaches.  Hematological:  Does not bruise/bleed easily.  Psychiatric/Behavioral:  The patient is not nervous/anxious.    Blood pressure (!) 153/84, pulse 85, temperature 98.4 F (36.9 C), temperature source Oral, resp. rate 13, height 6\' 2"  (1.88 m), weight 88.5 kg, SpO2 98%. Physical Exam Constitutional:      General: He is not in acute distress.    Appearance: He is well-developed. He is not diaphoretic.  HENT:     Head: Normocephalic and atraumatic.  Eyes:     General: No scleral icterus.       Right eye: No discharge.        Left eye: No discharge.     Conjunctiva/sclera: Conjunctivae normal.  Cardiovascular:     Rate and Rhythm: Normal rate and regular rhythm.  Pulmonary:     Effort: Pulmonary effort is normal. No respiratory distress.  Musculoskeletal:     Cervical back: Normal range of motion.     Comments: LLE Open ankle fx, no ecchymosis or rash  No knee effusion  Knee stable to varus/ valgus and anterior/posterior stress  Sens DPN, SPN, TN intact  Motor EHL 5/5  DP 2+, No significant edema  Skin:    General: Skin is warm and dry.  Neurological:     Mental Status: He is alert.  Psychiatric:  Mood and Affect: Mood normal.         Behavior: Behavior normal.     Assessment/Plan: Open left ankle fx -- Plan I&D, ex fix this afternoon by Dr. Susa Simmonds. Please keep NPO.    Freeman Caldron, PA-C Orthopedic Surgery [phone] 03/24/2023, 12:53 PM

## 2023-03-24 NOTE — ED Notes (Signed)
Pt mother contacted, pt talking on phone with mother

## 2023-03-24 NOTE — ED Notes (Signed)
Ortho MD to bedside.

## 2023-03-24 NOTE — ED Provider Notes (Signed)
[place] EMERGENCY DEPARTMENT AT [facility] Provider Note   CSN: [id number] Arrival date & time: 03/24/23  1143     History  Chief Complaint  Patient presents with   LEVEL 2 TRAUMA   Fall    Thomas [name] is a 42 y.o. male.  Patient is a 42 year old male with no significant past medical history presenting to the emergency department after a fall.  Per EMS, the patient was on top of a 20 foot ladder, about 21 feet in the air when he fell.  They state that he landed on his left ankle and that his ankle got stuck in the ladder rung.  The patient denied hitting his head or losing consciousness.  EMS noted an open fracture in the left ankle and did give the patient 2 g of Ancef and route as well as 100 mcg of fentanyl.  The patient denies any other pain from the fall.  He reports that he believes his tetanus is up-to-date.  The history is provided by the patient and the EMS personnel.  Fall       Home Medications Prior to Admission medications   Not on File      Allergies    Patient has no known allergies.    Review of Systems   Review of Systems  Physical Exam Updated Vital Signs BP (!) 153/84   Pulse 85   Temp 98.4 F (36.9 C) (Oral)   Resp 13   Ht 6\' 2"  (1.88 m)   Wt 88.5 kg   SpO2 98%   BMI 25.04 kg/m  Physical Exam Vitals and nursing note reviewed.  Constitutional:      General: He is not in acute distress.    Appearance: Normal appearance.  HENT:     Head: Normocephalic and atraumatic.     Nose: Nose normal.     Mouth/Throat:     Mouth: Mucous membranes are moist.     Pharynx: Oropharynx is clear.  Eyes:     Extraocular Movements: Extraocular movements intact.     Conjunctiva/sclera: Conjunctivae normal.  Neck:     Comments: No midline neck tenderness, c-collar in place Cardiovascular:     Rate and Rhythm: Normal rate and regular rhythm.     Pulses: Normal pulses.     Heart sounds: Normal heart sounds.  Pulmonary:     Effort:  Pulmonary effort is normal.     Breath sounds: Normal breath sounds.  Abdominal:     General: Abdomen is flat.     Palpations: Abdomen is soft.     Tenderness: There is no abdominal tenderness.  Musculoskeletal:     Comments: No midline back tenderness No bony tenderness to bilateral upper or right lower extremities Significant tenderness to palpation of left ankle with obviously open fracture with bone exposure, significant joint laxity 2+ DP pulse on the left  Skin:    General: Skin is warm and dry.  Neurological:     General: No focal deficit present.     Mental Status: He is alert and oriented to person, place, and time.  Psychiatric:        Mood and Affect: Mood normal.        Behavior: Behavior normal.     ED Results / Procedures / Treatments   Labs (all labs ordered are listed, but only abnormal results are displayed) Labs Reviewed  COMPREHENSIVE METABOLIC PANEL - Abnormal; Notable for the following components:      Result  Value   Glucose, Bld 145 (*)    Calcium 8.6 (*)    Total Protein 5.7 (*)    Albumin 3.1 (*)    All other components within normal limits  I-STAT CHEM 8, ED - Abnormal; Notable for the following components:   Glucose, Bld 143 (*)    Calcium, Ion 1.04 (*)    All other components within normal limits  CBC  ETHANOL  PROTIME-INR  URINALYSIS, ROUTINE W REFLEX MICROSCOPIC  HIV ANTIBODY (ROUTINE TESTING W REFLEX)  I-STAT CG4 LACTIC ACID, ED  I-STAT CHEM 8, ED  I-STAT CG4 LACTIC ACID, ED  SAMPLE TO BLOOD BANK    EKG None  Radiology CT HEAD WO CONTRAST Result Date: 03/24/2023 CLINICAL DATA:  Fall, poly trauma EXAM: CT HEAD WITHOUT CONTRAST CT CERVICAL SPINE WITHOUT CONTRAST TECHNIQUE: Multidetector CT imaging of the head and cervical spine was performed following the standard protocol without intravenous contrast. Multiplanar CT image reconstructions of the cervical spine were also generated. RADIATION DOSE REDUCTION: This exam was performed  according to the departmental dose-optimization program which includes automated exposure control, adjustment of the mA and/or kV according to patient size and/or use of iterative reconstruction technique. COMPARISON:  10/11/2007 CT head, 12/25/2015 CT cervical spine FINDINGS: CT HEAD FINDINGS Brain: No evidence of acute infarct, hemorrhage, mass, mass effect, or midline shift. No hydrocephalus or extra-axial fluid collection. Vascular: No hyperdense vessel. Skull: Negative for fracture or focal lesion. Sinuses/Orbits: Mucosal thickening in the ethmoid air cells, frontal sinuses, and right maxillary sinus. No acute finding in the orbits. Other: The mastoid air cells are well aerated. CT CERVICAL SPINE FINDINGS Alignment: No traumatic listhesis. Skull base and vertebrae: No acute fracture or suspicious osseous lesion. Soft tissues and spinal canal: No prevertebral fluid or swelling. No visible canal hematoma. Disc levels: Degenerative changes in the cervical spine.Moderate spinal canal stenosis at C5-C6. Upper chest: No focal pulmonary opacity or pleural effusion. Apical bullae/blebs. IMPRESSION: 1. No acute intracranial process. 2. No acute fracture or traumatic listhesis in the cervical spine. Electronically Signed   By: Wiliam Ke M.D.   On: 03/24/2023 13:31   CT CERVICAL SPINE WO CONTRAST Result Date: 03/24/2023 CLINICAL DATA:  Fall, poly trauma EXAM: CT HEAD WITHOUT CONTRAST CT CERVICAL SPINE WITHOUT CONTRAST TECHNIQUE: Multidetector CT imaging of the head and cervical spine was performed following the standard protocol without intravenous contrast. Multiplanar CT image reconstructions of the cervical spine were also generated. RADIATION DOSE REDUCTION: This exam was performed according to the departmental dose-optimization program which includes automated exposure control, adjustment of the mA and/or kV according to patient size and/or use of iterative reconstruction technique. COMPARISON:  10/11/2007 CT  head, 12/25/2015 CT cervical spine FINDINGS: CT HEAD FINDINGS Brain: No evidence of acute infarct, hemorrhage, mass, mass effect, or midline shift. No hydrocephalus or extra-axial fluid collection. Vascular: No hyperdense vessel. Skull: Negative for fracture or focal lesion. Sinuses/Orbits: Mucosal thickening in the ethmoid air cells, frontal sinuses, and right maxillary sinus. No acute finding in the orbits. Other: The mastoid air cells are well aerated. CT CERVICAL SPINE FINDINGS Alignment: No traumatic listhesis. Skull base and vertebrae: No acute fracture or suspicious osseous lesion. Soft tissues and spinal canal: No prevertebral fluid or swelling. No visible canal hematoma. Disc levels: Degenerative changes in the cervical spine.Moderate spinal canal stenosis at C5-C6. Upper chest: No focal pulmonary opacity or pleural effusion. Apical bullae/blebs. IMPRESSION: 1. No acute intracranial process. 2. No acute fracture or traumatic listhesis in the cervical spine.  Electronically Signed   By: Wiliam Ke M.D.   On: 03/24/2023 13:31    Procedures Procedures    Medications Ordered in ED Medications  enoxaparin (LOVENOX) injection 40 mg (has no administration in time range)  methocarbamol (ROBAXIN) tablet 500 mg (has no administration in time range)    Or  methocarbamol (ROBAXIN) injection 500 mg (has no administration in time range)  ondansetron (ZOFRAN) tablet 4 mg (has no administration in time range)    Or  ondansetron (ZOFRAN) injection 4 mg (has no administration in time range)  oxyCODONE (Oxy IR/ROXICODONE) immediate release tablet 5-15 mg (has no administration in time range)  morphine (PF) 2 MG/ML injection 2 mg (has no administration in time range)  morphine (PF) 4 MG/ML injection 4 mg (4 mg Intravenous Given 03/24/23 1144)  Tdap (BOOSTRIX) injection 0.5 mL (0.5 mLs Intramuscular Given 03/24/23 1145)    ED Course/ Medical Decision Making/ A&P Clinical Course as of 03/24/23 1354  Thu  Mar 24, 2023  1155 I spoke with Earney Hamburg PA with orthopedics who will see the patient. Recommended to keep NPO for likely OR today. [VK]  1352 No traumatic injury on CTH/C-spine. Will be admitted to ortho for OR. [VK]    Clinical Course User Index [VK] Rexford Maus, DO                                 Medical Decision Making This patient presents to the ED with chief complaint(s) of fall from ladder with no pertinent past medical history which further complicates the presenting complaint. The complaint involves an extensive differential diagnosis and also carries with it a high risk of complications and morbidity.    The differential diagnosis includes open ankle fracture, tib-fib fracture, ligamentous injury, due to significant trauma and distracting injury concern for ICH, mass effect or cervical spine fracture, no other traumatic injury seen on exam  Additional history obtained: Additional history obtained from EMS  Records reviewed N/A  ED Course and Reassessment: Patient was made a prehospital arrival level 2 trauma and I was immediately present at bedside on patient's arrival.  Primary survey was intact.  Secondary survey was significant for open fracture to the left ankle, pulses intact.  The patient was placed in a temporary splint and temporary dressing was applied.  He was given Ancef by EMS and reports that his tetanus is up-to-date.  He was given pain control.  The patient will have x-rays of his ankle and tib-fib as well as head CT and C-spine to evaluate for traumatic injury.  Orthopedics will be consulted.  Independent labs interpretation:  The following labs were independently interpreted: within normal range  Independent visualization of imaging: - I independently visualized the following imaging with scope of interpretation limited to determining acute life threatening conditions related to emergency care: CTH/C-spine, L ankle/tib fib XR, CXR, pelvis XR, which  revealed communited tib/fib fracture, no traumatic injury on CTH/C-spine  Consultation: - Consulted or discussed management/test interpretation w/ external professional: orthopedics  Consideration for admission or further workup: Patient requires admission for open ankle fracture Social Determinants of health: N/A    Amount and/or Complexity of Data Reviewed Labs: ordered. Radiology: ordered.  Risk Prescription drug management. Decision regarding hospitalization.          Final Clinical Impression(s) / ED Diagnoses Final diagnoses:  Fall from ladder, initial encounter  Type III open fracture of left tibia and  fibula, initial encounter    Rx / DC Orders ED Discharge Orders     None         Rexford Maus, DO 03/24/23 1354

## 2023-03-24 NOTE — Anesthesia Preprocedure Evaluation (Addendum)
Anesthesia Evaluation  Patient identified by MRN, date of birth, ID band Patient awake    Reviewed: Allergy & Precautions, NPO status , Patient's Chart, lab work & pertinent test results  History of Anesthesia Complications Negative for: history of anesthetic complications  Airway Mallampati: II  TM Distance: >3 FB Neck ROM: Full    Dental no notable dental hx.    Pulmonary asthma , Current Smoker and Patient abstained from smoking.   Pulmonary exam normal        Cardiovascular negative cardio ROS Normal cardiovascular exam     Neuro/Psych negative neurological ROS     GI/Hepatic negative GI ROS, Neg liver ROS,,,  Endo/Other  negative endocrine ROS    Renal/GU negative Renal ROS     Musculoskeletal FALL, OPEN FRACTURE OF LEFT TIBIA   Abdominal   Peds  Hematology negative hematology ROS (+)   Anesthesia Other Findings Day of surgery medications reviewed with patient.  Reproductive/Obstetrics                             Anesthesia Physical Anesthesia Plan  ASA: 2  Anesthesia Plan: General   Post-op Pain Management: Tylenol PO (pre-op)*   Induction: Intravenous  PONV Risk Score and Plan: 2 and Treatment may vary due to age or medical condition, Ondansetron, Dexamethasone and Midazolam  Airway Management Planned: LMA  Additional Equipment: None  Intra-op Plan:   Post-operative Plan: Extubation in OR  Informed Consent: I have reviewed the patients History and Physical, chart, labs and discussed the procedure including the risks, benefits and alternatives for the proposed anesthesia with the patient or authorized representative who has indicated his/her understanding and acceptance.     Dental advisory given  Plan Discussed with: CRNA  Anesthesia Plan Comments:        Anesthesia Quick Evaluation

## 2023-03-24 NOTE — Progress Notes (Signed)
Orthopedic Tech Progress Note Patient Details:  Thomas [name] [birth year] [id number]  Level II trauma, open LLE injury. Currently in a blue EMS splint, distal pulses present.  Patient ID: Thomas [name], male   DOB: [birth year], 42 y.o.   MRN: [id number]  [name] 03/24/2023, 12:16 PM

## 2023-03-24 NOTE — ED Notes (Signed)
Pt to short stay  

## 2023-03-24 NOTE — ED Notes (Signed)
X RAY at bedside 

## 2023-03-24 NOTE — Transfer of Care (Signed)
Immediate Anesthesia Transfer of Care Note  Patient: Thomas [name]  Procedure(s) Performed: Pleas Patricia AND DEBRIDEMENT EXTERNAL FIXATION ANKLE (Left: Ankle)  Patient Location: PACU  Anesthesia Type:General  Level of Consciousness: awake, alert , and oriented  Airway & Oxygen Therapy: Patient Spontanous Breathing and Patient connected to face mask oxygen  Post-op Assessment: Report given to RN and Post -op Vital signs reviewed and stable  Post vital signs: Reviewed and stable  Last Vitals:  Vitals Value Taken Time  BP 166/111 03/24/23 1845  Temp    Pulse 92 03/24/23 1845  Resp 10 03/24/23 1845  SpO2 96 % 03/24/23 1845  Vitals shown include unfiled device data.  Last Pain:  Vitals:   03/24/23 1507  TempSrc: Oral  PainSc: 10-Worst pain ever         Complications: No notable events documented.

## 2023-03-24 NOTE — Anesthesia Procedure Notes (Signed)
Procedure Name: LMA Insertion Date/Time: 03/24/2023 5:13 PM  Performed by: Jimmey Ralph, CRNAPre-anesthesia Checklist: Patient identified, Emergency Drugs available, Suction available and Patient being monitored Patient Re-evaluated:Patient Re-evaluated prior to induction Oxygen Delivery Method: Circle System Utilized Preoxygenation: Pre-oxygenation with 100% oxygen Induction Type: IV induction Ventilation: Mask ventilation without difficulty LMA: LMA inserted LMA Size: 5.0 Number of attempts: 1 Airway Equipment and Method: Bite block Placement Confirmation: positive ETCO2 and breath sounds checked- equal and bilateral Tube secured with: Tape Dental Injury: Teeth and Oropharynx as per pre-operative assessment

## 2023-03-24 NOTE — TOC CAGE-AID Note (Signed)
Transition of Care [facility]) - CAGE-AID Screening  Patient Details  Name: ELISIO ARKO MRN: [id number] Date of Birth: 10/31/1980  Clinical Narrative:  Patient denies any current alcohol use, states he did drink previously but not any more. Patient also denies any drug use. Patient endorses smoking cigarettes. Patient made aware of substance abuse resources available but denies need at this time.  CAGE-AID Screening:   Have You Ever Felt You Ought to Cut Down on Your Drinking or Drug Use?: No Have People Annoyed You By Critizing Your Drinking Or Drug Use?: No Have You Felt Bad Or Guilty About Your Drinking Or Drug Use?: No Have You Ever Had a Drink or Used Drugs First Thing In The Morning to Steady Your Nerves or to Get Rid of a Hangover?: No CAGE-AID Score: 0  Substance Abuse Education Offered: Yes

## 2023-03-24 NOTE — Progress Notes (Addendum)
Orthopedic Tech Progress Note Patient Details:  Thomas [name] Apr 14, 1980 [id number]  [name], [name], male   DOB: Jul 08, 1980, 42 y.o.   MRN: [id number]  [name] 03/24/2023, 3:29 PM

## 2023-03-24 NOTE — H&P (Signed)
Thomas [name] is an 42 y.o. male.   Chief Complaint: Left open intra-articular distal tibia and fibula fracture HPI: Patient fell from a ladder and got his left foot hung up in the ladder.  He was presented to the emergency department with open wound about the leg.  X-rays revealed displaced fracture of the distal tibia and fibula.  Orthopedics was consulted.  He was initially seen by orthopedic PA and given the severity of his injury was indicated for urgent surgery.  He is seen today in the preoperative holding area.  Complains of pain in the left ankle.  Pain is stable now with splint immobilization.  Denies any other joint or extremity injury.  Past Medical History:  Diagnosis Date   Allergy    Asthma    Low back pain     Past Surgical History:  Procedure Laterality Date   HAND SURGERY Right     Family History  Problem Relation Age of Onset   Diabetes Mother    Social History:  reports that he has been smoking cigarettes. He has never used smokeless tobacco. He reports that he does not drink alcohol and does not use drugs.  Allergies: No Known Allergies  No medications prior to admission.    Results for orders placed or performed during the hospital encounter of 03/24/23 (from the past 48 hours)  Comprehensive metabolic panel     Status: Abnormal   Collection Time: 03/24/23 11:51 AM  Result Value Ref Range   Sodium 138 135 - 145 mmol/L   Potassium 4.3 3.5 - 5.1 mmol/L   Chloride 108 98 - 111 mmol/L   CO2 22 22 - 32 mmol/L   Glucose, Bld 145 (H) 70 - 99 mg/dL    Comment: Glucose reference range applies only to samples taken after fasting for at least 8 hours.   BUN 10 6 - 20 mg/dL   Creatinine, Ser 8.41 0.61 - 1.24 mg/dL   Calcium 8.6 (L) 8.9 - 10.3 mg/dL   Total Protein 5.7 (L) 6.5 - 8.1 g/dL   Albumin 3.1 (L) 3.5 - 5.0 g/dL   AST 20 15 - 41 U/L   ALT 11 0 - 44 U/L   Alkaline Phosphatase 59 38 - 126 U/L   Total Bilirubin 0.3 <1.2 mg/dL   GFR, Estimated >32 >44  mL/min    Comment: (NOTE) Calculated using the CKD-EPI Creatinine Equation (2021)    Anion gap 8 5 - 15    Comment: Performed at [facility], [address], [place], [place] [address]  CBC     Status: None   Collection Time: 03/24/23 11:51 AM  Result Value Ref Range   WBC 8.1 4.0 - 10.5 K/uL   RBC 4.97 4.22 - 5.81 MIL/uL   Hemoglobin 14.3 13.0 - 17.0 g/dL   HCT 25.3 66.4 - 40.3 %   MCV 88.3 80.0 - 100.0 fL   MCH 28.8 26.0 - 34.0 pg   MCHC 32.6 30.0 - 36.0 g/dL   RDW 47.4 25.9 - 56.3 %   Platelets 229 150 - 400 K/uL   nRBC 0.0 0.0 - 0.2 %    Comment: Performed at [facility], [address] [place], [place] [address]  Ethanol     Status: None   Collection Time: 03/24/23 11:51 AM  Result Value Ref Range   Alcohol, Ethyl (B) <10 <10 mg/dL    Comment: (NOTE) Lowest detectable limit for serum alcohol is 10 mg/dL.  For medical purposes only. Performed at [facility], [address], [place], [place] [address]   Protime-INR     Status: None   Collection Time: 03/24/23 11:51 AM  Result Value Ref Range   Prothrombin Time 13.6 11.4 - 15.2 seconds   INR 1.0 0.8 - 1.2    Comment: (NOTE) INR goal varies based on device and disease states. Performed at [facility], [address] [place], [place] [address]   Sample to Blood Bank     Status: None   Collection Time: 03/24/23 11:52 AM  Result Value Ref Range   Blood Bank Specimen SAMPLE AVAILABLE FOR TESTING    Sample Expiration      03/27/2023,2359 Performed at [facility], [address], [place], [place] [address]   I-Stat Chem 8, ED     Status: Abnormal   Collection Time: 03/24/23 12:00 PM  Result Value Ref Range   Sodium 139 135 - 145 mmol/L   Potassium 4.4 3.5 - 5.1 mmol/L   Chloride 107 98 - 111 mmol/L   BUN 12 6 - 20 mg/dL   Creatinine, Ser 9.14 0.61 - 1.24 mg/dL   Glucose, Bld 782 (H) 70 - 99 mg/dL    Comment: Glucose reference range applies only to samples taken after fasting  for at least 8 hours.   Calcium, Ion 1.04 (L) 1.15 - 1.40 mmol/L   TCO2 24 22 - 32 mmol/L   Hemoglobin 14.6 13.0 - 17.0 g/dL   HCT 95.6 21.3 - 08.6 %  I-Stat Lactic Acid, ED     Status: None   Collection Time: 03/24/23 12:00 PM  Result Value Ref Range   Lactic Acid, Venous 1.7 0.5 - 1.9 mmol/L   DG Tibia/Fibula Left Port Result Date: 03/24/2023 CLINICAL DATA:  Blunt Trauma.  Fall off ladder. EXAM: PORTABLE LEFT TIBIA AND FIBULA - 2 VIEW COMPARISON:  None Available. FINDINGS: Proximal left leg demonstrates acute minimally displaced left fibular head and neck fracture. Proximal tibia unremarkable. Knee grossly unremarkable in partially visualized. Please see separately dictated x-ray left ankle [id number] acute fractures. IMPRESSION: 1. Proximal left leg demonstrates acute minimally displaced left fibular head and neck fracture. 2. Please see separately dictated x-ray left ankle 03/24/2023 regarding acute fractures. Electronically Signed   By: Tish Frederickson M.D.   On: 03/24/2023 14:11   DG Pelvis Portable Result Date: 03/24/2023 CLINICAL DATA:  Trauma.  Fell off ladder. EXAM: PORTABLE PELVIS 1-2 VIEWS COMPARISON:  None Available. FINDINGS: No acute displaced fracture or dislocation of the right hip. No dislocation of the left hip. Poorly visualized left femoral neck with underlying fracture not excluded given patient rotation of the hip. There is no evidence of pelvic fracture or diastasis. Left hip subcutaneus soft tissue edema. IMPRESSION: 1. Negative for definite acute traumatic injury. 2. Poorly visualized left femoral neck fracture with associated overlying soft tissue edema changes hip rotation. Recommend dedicated left hip radiographs. Electronically Signed   By: Tish Frederickson M.D.   On: 03/24/2023 14:06   DG Ankle Left Port Result Date: 03/24/2023 CLINICAL DATA:  Blunt Trauma fall off ladder EXAM: PORTABLE LEFT ANKLE - 2 VIEW COMPARISON:  None Available. FINDINGS: Acute nondisplaced medial  malleolar fracture. Acute greater than full shaft width displaced and comminuted transsyndesmotic distal fibular fracture. Poorly visualized acute comminuted and displaced posterior malleolar fracture. Open markedly comminuted and laterally displaced distal tibial shaft fracture. Diffuse left ankle subcutaneus soft tissue edema with overlying soft tissue defect  and protruding tibial fracture shaft. Talar fracture not excluded. No definite dislocation, possible subluxation of the ankle joint. IMPRESSION: 1. Acute, open, markedly comminuted and laterally displaced distal tibial shaft fracture. 2. Acute, open, comminuted and laterally displaced trimalleolar fracture. 3. Recommend CT for further evaluation. 4. No definite dislocation, possible subluxation of the ankle joint. Electronically Signed   By: Tish Frederickson M.D.   On: 03/24/2023 14:04   DG Chest Port 1 View Result Date: 03/24/2023 CLINICAL DATA:  Trauma EXAM: PORTABLE CHEST 1 VIEW COMPARISON:  Chest x-ray 04/04/2019 FINDINGS: The heart and mediastinal contours are within normal limits. No focal consolidation. No pulmonary edema. No pleural effusion. No pneumothorax. No acute osseous abnormality. IMPRESSION: No active disease. Electronically Signed   By: Tish Frederickson M.D.   On: 03/24/2023 13:58   CT HEAD WO CONTRAST Result Date: 03/24/2023 CLINICAL DATA:  Fall, poly trauma EXAM: CT HEAD WITHOUT CONTRAST CT CERVICAL SPINE WITHOUT CONTRAST TECHNIQUE: Multidetector CT imaging of the head and cervical spine was performed following the standard protocol without intravenous contrast. Multiplanar CT image reconstructions of the cervical spine were also generated. RADIATION DOSE REDUCTION: This exam was performed according to the departmental dose-optimization program which includes automated exposure control, adjustment of the mA and/or kV according to patient size and/or use of iterative reconstruction technique. COMPARISON:  10/11/2007 CT head,  12/25/2015 CT cervical spine FINDINGS: CT HEAD FINDINGS Brain: No evidence of acute infarct, hemorrhage, mass, mass effect, or midline shift. No hydrocephalus or extra-axial fluid collection. Vascular: No hyperdense vessel. Skull: Negative for fracture or focal lesion. Sinuses/Orbits: Mucosal thickening in the ethmoid air cells, frontal sinuses, and right maxillary sinus. No acute finding in the orbits. Other: The mastoid air cells are well aerated. CT CERVICAL SPINE FINDINGS Alignment: No traumatic listhesis. Skull base and vertebrae: No acute fracture or suspicious osseous lesion. Soft tissues and spinal canal: No prevertebral fluid or swelling. No visible canal hematoma. Disc levels: Degenerative changes in the cervical spine.Moderate spinal canal stenosis at C5-C6. Upper chest: No focal pulmonary opacity or pleural effusion. Apical bullae/blebs. IMPRESSION: 1. No acute intracranial process. 2. No acute fracture or traumatic listhesis in the cervical spine. Electronically Signed   By: Wiliam Ke M.D.   On: 03/24/2023 13:31   CT CERVICAL SPINE WO CONTRAST Result Date: 03/24/2023 CLINICAL DATA:  Fall, poly trauma EXAM: CT HEAD WITHOUT CONTRAST CT CERVICAL SPINE WITHOUT CONTRAST TECHNIQUE: Multidetector CT imaging of the head and cervical spine was performed following the standard protocol without intravenous contrast. Multiplanar CT image reconstructions of the cervical spine were also generated. RADIATION DOSE REDUCTION: This exam was performed according to the departmental dose-optimization program which includes automated exposure control, adjustment of the mA and/or kV according to patient size and/or use of iterative reconstruction technique. COMPARISON:  10/11/2007 CT head, 12/25/2015 CT cervical spine FINDINGS: CT HEAD FINDINGS Brain: No evidence of acute infarct, hemorrhage, mass, mass effect, or midline shift. No hydrocephalus or extra-axial fluid collection. Vascular: No hyperdense vessel. Skull:  Negative for fracture or focal lesion. Sinuses/Orbits: Mucosal thickening in the ethmoid air cells, frontal sinuses, and right maxillary sinus. No acute finding in the orbits. Other: The mastoid air cells are well aerated. CT CERVICAL SPINE FINDINGS Alignment: No traumatic listhesis. Skull base and vertebrae: No acute fracture or suspicious osseous lesion. Soft tissues and spinal canal: No prevertebral fluid or swelling. No visible canal hematoma. Disc levels: Degenerative changes in the cervical spine.Moderate spinal canal stenosis at C5-C6. Upper chest: No focal pulmonary opacity  or pleural effusion. Apical bullae/blebs. IMPRESSION: 1. No acute intracranial process. 2. No acute fracture or traumatic listhesis in the cervical spine. Electronically Signed   By: Wiliam Ke M.D.   On: 03/24/2023 13:31    Review of Systems  Constitutional: Negative.   HENT: Negative.    Eyes: Negative.   Respiratory: Negative.    Musculoskeletal:  Positive for arthralgias.  Skin:  Positive for wound.  All other systems reviewed and are negative.   Blood pressure (!) 162/99, pulse 70, temperature 98.7 F (37.1 C), temperature source Oral, resp. rate 18, height 6\' 2"  (1.88 m), weight 88.5 kg, SpO2 99%. Physical Exam HENT:     Head: Normocephalic.     Mouth/Throat:     Mouth: Mucous membranes are moist.  Eyes:     Extraocular Movements: Extraocular movements intact.  Cardiovascular:     Rate and Rhythm: Normal rate.  Pulmonary:     Effort: Pulmonary effort is normal.  Abdominal:     General: Abdomen is flat.  Musculoskeletal:     Cervical back: Neck supple.     Comments: Left ankle in a short leg splint with notable strikethrough from bleeding.  Exposed foot is without evidence of injury.  Warm and well-perfused.  Able to wiggle toes.  Proximal to the splint no evidence of injury.  No evidence of bilateral upper or right lower extremity injury.  Neurological:     Mental Status: He is alert.   Psychiatric:        Mood and Affect: Mood normal.      Assessment/Plan Left open distal tibia and fibula fracture  Will plan for urgent irrigation and debridement with open reduction and external fixator placement.  We discussed based on the severity of the injury keeping him in the hospital overnight for monitoring and for CT scan obtained.  He will likely be discharged home tomorrow and will plan for definitive fixation at a later date after period of soft tissue rest.  We discussed the risk, benefits and alternatives of surgery which include but not limited to wound healing complication, infection, nonunion, need for further surgery and damage surrounding structure.  He also understands that he will need definitive surgery at a later date.  He wishes to proceed.     Terance Hart, MD 03/24/2023, 4:33 PM

## 2023-03-25 ENCOUNTER — Encounter (HOSPITAL_COMMUNITY): Payer: Self-pay | Admitting: Orthopaedic Surgery

## 2023-03-25 ENCOUNTER — Other Ambulatory Visit: Payer: Self-pay | Admitting: Orthopaedic Surgery

## 2023-03-25 MED ORDER — OXYCODONE HCL 5 MG PO TABS: ORAL_TABLET | ORAL | 0 refills | Status: AC

## 2023-03-25 MED ORDER — ASPIRIN 325 MG PO TABS: ORAL_TABLET | ORAL | 0 refills | Status: AC

## 2023-03-25 MED ORDER — AMOXICILLIN-POT CLAVULANATE 875-125 MG PO TABS: 1.0000 | ORAL_TABLET | Freq: Two times a day (BID) | ORAL | 0 refills | Status: AC

## 2023-03-25 NOTE — Progress Notes (Addendum)
AVS reviewed.  Patient excited for discharge.  Patient denied need for walker DME.  Patient leaving with all valuables and crutches.  LCSW advised this nurse to tell the patient to begin Medicaid application online when they get home today.

## 2023-03-25 NOTE — Progress Notes (Signed)
     Thomas [name] is a 42 y.o. male   Orthopaedic diagnosis: Status post left ankle irrigation debridement and application of external fixator for open left pilon ankle fracture 03/24/2023  Subjective: Patient resting comfortably.  He states pain is controlled.  He is eager to go home.  He understands the severity of his injury and indication for additional surgery for definitive treatment.  No notable numbness or tingling.  Objectyive: Vitals:   03/25/23 0016 03/25/23 0418  BP: 116/72 127/72  Pulse: 70 82  Resp: 18 18  Temp: 98.4 F (36.9 C) 97.8 F (36.6 C)  SpO2: 97% 98%     Exam: Awake and alert Respirations even and unlabored No acute distress  Examination left lower extremity demonstrates external fixator in place with grossly well aligned foot and ankle.  Mild bleeding from lateral calcaneal pin site.  No active bleeding.  He is able to wiggle toes and endorses intact sensation light touch in the toes.  Warm and well-perfused.  Assessment: Postop day 1 status post above, doing well  Plan: Patient requesting to be discharged home.  He understands he will require additional surgery for definitive treatment.  His pain is surprisingly well-controlled and he feels well.  This is reasonable.  Plan to discharge him home today and have him follow-up Monday in clinic for repeat evaluation and discussion of additional surgical intervention.  Discussed the importance of nonweightbearing and soft tissue rest.  He understands.  -Nonweightbearing left lower extremity -Keep external fixator and dressing in place, please reinforce dressing with an Ace wrap if needed. -Oxycodone for pain control at discharge -Aspirin for DVT prophylaxis -Augmentin for open fracture -Crutches ordered.  Again, we will plan to see him Monday in clinic.  He was given our contact information to the office and will call with any questions.  He is understanding of this plan.   Zayvier Caravello J. [place],  PA-C

## 2023-03-25 NOTE — Discharge Instructions (Signed)
DR. Susa Simmonds FOOT & ANKLE SURGERY POST-OP INSTRUCTIONS   Pain Management The numbing medicine and your leg will last around 18 hours, take a dose of your pain medicine as soon as you feel it wearing off to avoid rebound pain. Keep your foot elevated above heart level.  Make sure that your heel hangs free ('floats'). Take all prescribed medication as directed. If taking narcotic pain medication you may want to use an over-the-counter stool softener to avoid constipation. You may take over-the-counter NSAIDs (ibuprofen, naproxen, etc.) as well as over-the-counter acetaminophen as directed on the packaging as a supplement for your pain and may also use it to wean away from the prescription medication.  Activity Non-weightbearing Keep external fixator intact  First Postoperative Visit Your first postop visit will be at least 2 weeks after surgery.  This should be scheduled when you schedule surgery. If you do not have a postoperative visit scheduled please call [phone] to schedule an appointment. At the appointment your incision will be evaluated for suture removal, x-rays will be obtained if necessary.  General Instructions Swelling is very common after foot and ankle surgery.  It often takes 3 months for the foot and ankle to begin to feel comfortable.  Some amount of swelling will persist for 6-12 months. DO NOT change the dressing.  If there is a problem with the dressing (too tight, loose, gets wet, etc.) please contact Dr. Donnie Mesa office. DO NOT get the dressing wet.  For showers you can use an over-the-counter cast cover or wrap a washcloth around the top of your dressing and then cover it with a plastic bag and tape it to your leg. DO NOT soak the incision (no tubs, pools, bath, etc.) until you have approval from Dr. Susa Simmonds.  Contact Dr. Garret Reddish office or go to Emergency Room if: Temperature above 101 F. Increasing pain that is unresponsive to pain medication or elevation Excessive  redness or swelling in your foot Dressing problems - excessive bloody drainage, looseness or tightness, or if dressing gets wet Develop pain, swelling, warmth, or discoloration of your calf

## 2023-03-25 NOTE — Evaluation (Signed)
Physical Therapy Evaluation Patient Details Name: TAVYN FLUITT MRN: [id number] DOB: 11/14/1980 Today's Date: 03/25/2023  History of Present Illness  Pt is a 42 y/o male presenting on 12/19 after falling from a ladder. Xray with displaced fracture of L distal tibia and fibula. 12/19 S/P I&D of L ankle fx, closed reduction of dital tibia fx, placement of external fixator. PMH includes: low back pain, asthma, R hand surgery.   Clinical Impression  Pt in bed upon arrival and agreeable to PT eval. Pt was independent w/ mobility prior to admit with no AD. Pt was able to stand and ambulate ~40 ft w/ crutches and supervision. Pt has no concerns about current mobility level or safety upon discharging home. Pt presents to therapy session with decreased balance and mobility. Pt would benefit from acute skilled PT to address functional impairments. Acute PT to follow.          If plan is discharge home, recommend the following: A little help with walking and/or transfers;Assist for transportation   Can travel by private vehicle    Yes    Equipment Recommendations Crutches (Pt is 32ft 2 inches)     Functional Status Assessment Patient has had a recent decline in their functional status and demonstrates the ability to make significant improvements in function in a reasonable and predictable amount of time.     Precautions / Restrictions Precautions Precautions: Fall Precaution Comments: L LE ex-fix Restrictions Weight Bearing Restrictions Per Provider Order: Yes LLE Weight Bearing Per Provider Order: Non weight bearing      Mobility  Bed Mobility Overal bed mobility: Modified Independent    General bed mobility comments: HOB elevated    Transfers Overall transfer level: Needs assistance Equipment used: Crutches Transfers: Sit to/from Stand Sit to Stand: Supervision      General transfer comment: stands w/ crutches on either side, pushes with 1 UE on bed and 1UE on crutch  handle. Supervision for safety    Ambulation/Gait Ambulation/Gait assistance: Supervision Gait Distance (Feet): 40 Feet Assistive device: Crutches Gait Pattern/deviations: Step-to pattern Gait velocity: dec     General Gait Details: hop to gait pattern, steady w/ no LOB        Balance Overall balance assessment: Needs assistance Sitting-balance support: No upper extremity supported, Feet supported Sitting balance-Leahy Scale: Good     Standing balance support: Bilateral upper extremity supported, During functional activity, Reliant on assistive device for balance Standing balance-Leahy Scale: Poor Standing balance comment: reliant on UE support       Pertinent Vitals/Pain Pain Assessment Pain Assessment: No/denies pain    Home Living Family/patient expects to be discharged to:: Private residence Living Arrangements: Parent (mother, his son (14)) Available Help at Discharge: Family;Available 24 hours/day Type of Home: House Home Access: Level entry       Home Layout: One level Home Equipment: None      Prior Function Prior Level of Function : Independent/Modified Independent;Driving;Working/employed    Mobility Comments: Independent w/ no AD ADLs Comments: moves furniture     Extremity/Trunk Assessment   Upper Extremity Assessment Upper Extremity Assessment: Defer to OT evaluation    Lower Extremity Assessment Lower Extremity Assessment: LLE deficits/detail LLE Deficits / Details: WFL knee and hip ROM, limited ankle ROM 2/2 ex-fix. Able to wiggle toes    Cervical / Trunk Assessment Cervical / Trunk Assessment: Normal  Communication   Communication Communication: No apparent difficulties Cueing Techniques: Verbal cues  Cognition Arousal: Alert Behavior During Therapy: [facility] for tasks  assessed/performed Overall Cognitive Status: Within Functional Limits for tasks assessed          General Comments General comments (skin integrity, edema, etc.):  VSS on RA, ex-fix in place     PT Assessment Patient needs continued PT services  PT Problem List Decreased strength;Decreased balance;Decreased activity tolerance;Decreased range of motion;Decreased mobility       PT Treatment Interventions DME instruction;Gait training;Functional mobility training;Therapeutic activities;Therapeutic exercise;Balance training;Neuromuscular re-education;Patient/family education    PT Goals (Current goals can be found in the Care Plan section)  Acute Rehab PT Goals Patient Stated Goal: to get  better PT Goal Formulation: With patient Time For Goal Achievement: 04/08/23 Potential to Achieve Goals: Good    Frequency Min 1X/week        AM-PAC PT "6 Clicks" Mobility  Outcome Measure Help needed turning from your back to your side while in a flat bed without using bedrails?: None Help needed moving from lying on your back to sitting on the side of a flat bed without using bedrails?: A Little Help needed moving to and from a bed to a chair (including a wheelchair)?: A Little Help needed standing up from a chair using your arms (e.g., wheelchair or bedside chair)?: A Little Help needed to walk in hospital room?: A Little Help needed climbing 3-5 steps with a railing? : A Little 6 Click Score: 19    End of Session Equipment Utilized During Treatment: Gait belt Activity Tolerance: Patient tolerated treatment well Patient left: in bed;with call bell/phone within reach Nurse Communication: Mobility status PT Visit Diagnosis: Unsteadiness on feet (R26.81)    Time: 1610-9604 PT Time Calculation (min) (ACUTE ONLY): 11 min   Charges:   PT Evaluation $PT Eval Low Complexity: 1 Low   PT General Charges $$ ACUTE PT VISIT: 1 Visit         Hilton Cork, PT, DPT Secure Chat Preferred  Rehab Office [phone]   Arturo Morton Brion Aliment 03/25/2023, 9:29 AM

## 2023-03-25 NOTE — Plan of Care (Signed)

## 2023-03-25 NOTE — Plan of Care (Signed)

## 2023-03-25 NOTE — Evaluation (Signed)
Occupational Therapy Evaluation Patient Details Name: Thomas [name] MRN: [id number] DOB: 02-16-1981 Today's Date: 03/25/2023   History of Present Illness Pt is a 42 y/o male presenting on 12/19 after falling from a ladder. Xray with displaced fracture of L distal tibia and fibula. 12/19 S/P I&D of L ankle fx, closed reduction of dital tibia fx, placement of external fixator. PMH includes: low back pain, asthma, R hand surgery.   Clinical Impression   Patient admitted for above and limited by problem list below.  He is able to complete ADLs with up to min assist (for LB dressing/bathing), transfers and mobility using RW with min guard fading to supervision.  He is adhering to NWB to L LE without cueing.  He has good support at home as needed.  Based on performance today, no further OT needs identified at this time.  OT will sign off.       If plan is discharge home, recommend the following: A little help with walking and/or transfers;A little help with bathing/dressing/bathroom;Assistance with cooking/housework;Assist for transportation;Help with stairs or ramp for entrance    Functional Status Assessment  Patient has had a recent decline in their functional status and demonstrates the ability to make significant improvements in function in a reasonable and predictable amount of time.  Equipment Recommendations  None recommended by OT    Recommendations for Other Services       Precautions / Restrictions Precautions Precautions: Fall Precaution Comments: L LE ex-fix Restrictions Weight Bearing Restrictions Per Provider Order: Yes LLE Weight Bearing Per Provider Order: Non weight bearing      Mobility Bed Mobility Overal bed mobility: Modified Independent             General bed mobility comments: HOB elevated    Transfers Overall transfer level: Needs assistance Equipment used: Crutches Transfers: Sit to/from Stand Sit to Stand: Contact guard assist,  Supervision           General transfer comment: min guard fading to supervision with transfers using RW, cueing for hand placement      Balance Overall balance assessment: Needs assistance Sitting-balance support: No upper extremity supported, Feet supported Sitting balance-Leahy Scale: Good     Standing balance support: Bilateral upper extremity supported, During functional activity, Reliant on assistive device for balance Standing balance-Leahy Scale: Poor Standing balance comment: able to engage in ADLs with 0-1 hand support briefly but dynamically relies on UE support on RW (vs crutches as used with PT)                           ADL either performed or assessed with clinical judgement   ADL Overall ADL's : Needs assistance/impaired     Grooming: Supervision/safety;Wash/dry hands;Standing           Upper Body Dressing : Set up;Sitting   Lower Body Dressing: Sit to/from stand;Minimal assistance Lower Body Dressing Details (indicate cue type and reason): assist for L LE due to external fixator, min guard to supervision in standing with 0-1 hand support Toilet Transfer: Supervision/safety;Ambulation;Rolling walker (2 wheels)   Toileting- Clothing Manipulation and Hygiene: Supervision/safety;Sit to/from Nurse, children's Details (indicate cue type and reason): pt plans to basin bathe initally due to L LE external fix Functional mobility during ADLs: Contact guard assist;Supervision/safety;Rolling walker (2 wheels) General ADL Comments: min guard fading to supervision using RW, educated on energy conservation and safety due to external fixator to L LE  Vision Baseline Vision/History: 1 Wears glasses       Perception         Praxis         Pertinent Vitals/Pain Pain Assessment Pain Assessment: No/denies pain     Extremity/Trunk Assessment Upper Extremity Assessment Upper Extremity Assessment: Overall WFL for tasks assessed    Lower Extremity Assessment Lower Extremity Assessment: Defer to PT evaluation LLE Deficits / Details: WFL knee and hip ROM, limited ankle ROM 2/2 ex-fix. Able to wiggle toes   Cervical / Trunk Assessment Cervical / Trunk Assessment: Normal   Communication Communication Communication: No apparent difficulties Cueing Techniques: Verbal cues   Cognition Arousal: Alert Behavior During Therapy: WFL for tasks assessed/performed Overall Cognitive Status: Within Functional Limits for tasks assessed                                       General Comments  ex fix to L LE    Exercises     Shoulder Instructions      Home Living Family/patient expects to be discharged to:: Private residence Living Arrangements: Parent (mother, his son (14)) Available Help at Discharge: Family;Available 24 hours/day Type of Home: House Home Access: Level entry     Home Layout: One level     Bathroom Shower/Tub: Tub/shower unit;Walk-in shower (uses tub normally)   Firefighter: Standard Bathroom Accessibility: Yes How Accessible: Accessible via walker Home Equipment: None          Prior Functioning/Environment Prior Level of Function : Independent/Modified Independent;Driving;Working/employed             Mobility Comments: Independent w/ no AD ADLs Comments: moves furniture        OT Problem List: Decreased activity tolerance;Impaired balance (sitting and/or standing);Decreased knowledge of precautions      OT Treatment/Interventions:      OT Goals(Current goals can be found in the care plan section) Acute Rehab OT Goals Patient Stated Goal: home today OT Goal Formulation: With patient  OT Frequency:      Co-evaluation              AM-PAC OT "6 Clicks" Daily Activity     Outcome Measure Help from another person eating meals?: None Help from another person taking care of personal grooming?: A Little Help from another person toileting, which includes  using toliet, bedpan, or urinal?: A Little Help from another person bathing (including washing, rinsing, drying)?: A Little Help from another person to put on and taking off regular upper body clothing?: A Little Help from another person to put on and taking off regular lower body clothing?: A Little 6 Click Score: 19   End of Session Equipment Utilized During Treatment: Gait belt;Rolling walker (2 wheels) Nurse Communication: Mobility status  Activity Tolerance: Patient tolerated treatment well Patient left: with call bell/phone within reach;Other (comment) (EOB with PT present)  OT Visit Diagnosis: Other abnormalities of gait and mobility (R26.89)                Time: 1610-9604 OT Time Calculation (min): 18 min Charges:  OT General Charges $OT Visit: 1 Visit OT Evaluation $OT Eval Low Complexity: 1 Low  Barry Brunner, OT Acute Rehabilitation Services Office [phone]   Chancy Milroy 03/25/2023, 10:27 AM

## 2023-03-25 NOTE — Progress Notes (Signed)
Orthopedic Tech Progress Note Patient Details:  Thomas [name] 1981/01/12 [id number]  Ortho Devices Type of Ortho Device: Crutches Ortho Device/Splint Interventions: Ordered, Adjustment   Post Interventions Patient Tolerated: Well, Ambulated well Instructions Provided: Adjustment of device, Poper ambulation with device  [name] 03/25/2023, 12:06 PM

## 2023-03-25 NOTE — Discharge Summary (Signed)
Patient ID: ATLANTIS RAUSEO MRN: [id number] DOB/AGE: 40/29/1982 40 y.o.  Admit date: 03/24/2023 Discharge date: 03/25/2023  Admission Diagnoses:  Principal Problem:   Open left ankle fracture Active Problems:   Open fracture of distal end of fibula and tibia   Discharge Diagnoses:  Same  Past Medical History:  Diagnosis Date   Allergy    Asthma    Low back pain     Surgeries: Procedure(s): IRRIGAION AND DEBRIDEMENT EXTERNAL FIXATION ANKLE on 03/24/2023   Consultants:   Discharged Condition: Improved  Hospital Course: GARRIK FRANZESE is an 42 y.o. male who was admitted 03/24/2023 for operative treatment ofOpen left ankle fracture. Patient has severe unremitting pain that affects sleep, daily activities, and work/hobbies. After pre-op clearance the patient was taken to the operating room on 03/24/2023 and underwent  Procedure(s): IRRIGAION AND DEBRIDEMENT EXTERNAL FIXATION ANKLE.    Patient was given perioperative antibiotics:  Anti-infectives (From admission, onward)    Start     Dose/Rate Route Frequency Ordered Stop   03/25/23 0600  ceFAZolin (ANCEF) IVPB 2g/100 mL premix        2 g 200 mL/hr over 30 Minutes Intravenous On call to O.R. 03/24/23 1441 03/24/23 1720   03/25/23 0000  amoxicillin-clavulanate (AUGMENTIN) 875-125 MG tablet        1 tablet Oral 2 times daily 03/25/23 0825     03/24/23 1444  ceFAZolin (ANCEF) 2-4 GM/100ML-% IVPB       Note to Pharmacy: Lurena Nida: cabinet override      03/24/23 1444 03/24/23 1723        Patient was given sequential compression devices, early ambulation, and chemoprophylaxis to prevent DVT.  Patient benefited maximally from hospital stay and there were no complications.    Recent vital signs: Patient Vitals for the past 24 hrs:  BP Temp Temp src Pulse Resp SpO2 Height Weight  03/25/23 0418 127/72 97.8 F (36.6 C) Oral 82 18 98 % -- --  03/25/23 0016 116/72 98.4 F (36.9 C) -- 70 18 97 % -- --  03/24/23 1952  (!) 135/91 99.2 F (37.3 C) Oral 84 18 94 % -- --  03/24/23 1930 (!) 140/89 99.1 F (37.3 C) -- 85 13 91 % -- --  03/24/23 1915 (!) 140/88 -- -- 84 12 91 % -- --  03/24/23 1900 (!) 153/94 -- -- 92 12 95 % -- --  03/24/23 1845 (!) 159/105 -- -- 89 10 99 % -- --  03/24/23 1839 (!) 168/108 99 F (37.2 C) -- 99 13 99 % -- --  03/24/23 1507 (!) 162/99 98.7 F (37.1 C) Oral 70 18 99 % -- --  03/24/23 1447 (!) 166/98 -- -- -- (!) 23 -- -- --  03/24/23 1442 -- -- -- -- 12 -- -- --  03/24/23 1437 -- -- -- 79 16 96 % -- --  03/24/23 1432 (!) 158/96 -- -- -- 14 -- -- --  03/24/23 1427 -- -- -- -- 16 -- -- --  03/24/23 1422 -- -- -- -- 13 -- -- --  03/24/23 1417 (!) 168/106 -- -- -- 13 -- -- --  03/24/23 1412 -- -- -- -- 16 -- -- --  03/24/23 1407 -- -- -- -- 13 -- -- --  03/24/23 1402 (!) 168/98 -- -- -- 15 -- -- --  03/24/23 1357 -- -- -- -- 13 -- -- --  03/24/23 1352 -- -- -- -- 18 -- -- --  03/24/23 1347 -- -- -- --  14 -- -- --  03/24/23 1345 (!) 153/94 -- -- 89 16 100 % -- --  03/24/23 1342 -- -- -- -- 14 -- -- --  03/24/23 1337 -- -- -- -- 15 -- -- --  03/24/23 1332 (!) 158/90 -- -- -- 13 -- -- --  03/24/23 1327 -- -- -- -- 16 -- -- --  03/24/23 1322 -- -- -- -- 17 -- -- --  03/24/23 1317 (!) 158/84 -- -- 76 19 92 % -- --  03/24/23 1312 -- -- -- -- 14 -- -- --  03/24/23 1307 -- -- -- -- 17 -- -- --  03/24/23 1302 (!) 146/97 -- -- -- 10 -- -- --  03/24/23 1257 -- -- -- -- 13 -- -- --  03/24/23 1252 -- -- -- 81 15 99 % -- --  03/24/23 1247 (!) 150/93 -- -- 88 18 96 % -- --  03/24/23 1242 -- -- -- 61 17 100 % -- --  03/24/23 1237 -- -- -- 72 15 100 % -- --  03/24/23 1232 -- -- -- 90 17 100 % -- --  03/24/23 1230 (!) 153/84 -- -- 85 13 98 % -- --  03/24/23 1227 -- -- -- 85 (!) 22 100 % -- --  03/24/23 1222 -- -- -- 76 12 100 % -- --  03/24/23 1220 -- -- -- -- -- -- 6\' 2"  (1.88 m) 88.5 kg  03/24/23 1212 -- -- -- -- 18 -- -- --  03/24/23 1207 -- -- -- -- 18 -- -- --  03/24/23 1202  (!) 150/95 -- -- -- (!) 22 -- -- --  03/24/23 1157 -- -- -- -- (!) 21 -- -- --  03/24/23 1152 -- -- -- -- 14 -- -- --  03/24/23 1147 (!) 150/90 98.4 F (36.9 C) Oral 86 18 100 % -- --     Recent laboratory studies:  Recent Labs    03/24/23 1151 03/24/23 1200 03/24/23 2123  WBC 8.1  --  11.7*  HGB 14.3 14.6 12.8*  HCT 43.9 43.0 38.6*  PLT 229  --  239  NA 138 139  --   K 4.3 4.4  --   CL 108 107  --   CO2 22  --   --   BUN 10 12  --   CREATININE 1.04 1.00 1.24  GLUCOSE 145* 143*  --   INR 1.0  --   --   CALCIUM 8.6*  --   --      Discharge Medications:   Allergies as of 03/25/2023   No Known Allergies      Medication List     TAKE these medications    amoxicillin-clavulanate 875-125 MG tablet Commonly known as: AUGMENTIN Take 1 tablet by mouth 2 (two) times daily. For 10 days   aspirin 325 MG tablet Commonly known as: Bayer Aspirin Take 1 tablet by mouth twice daily for 30 DAYS for blood clot prevention   oxyCODONE 5 MG immediate release tablet Commonly known as: Oxy IR/ROXICODONE Take 1-2 tablets by mouth every 4 to 6 hours as needed for pain               Durable Medical Equipment  (From admission, onward)           Start     Ordered   03/25/23 0000  DME Crutches        03/25/23 0825  Discharge Care Instructions  (From admission, onward)           Start     Ordered   03/25/23 0000  Non weight bearing       Question Answer Comment  Laterality left   Extremity Lower      03/25/23 0825            Diagnostic Studies: CT Pelvis Limited W/O Cm Result Date: 03/24/2023 CLINICAL DATA:  Pelvic fracture, left femoral neck. EXAM: CT PELVIS WITHOUT CONTRAST TECHNIQUE: Multidetector CT imaging of the pelvis was performed following the standard protocol without intravenous contrast. RADIATION DOSE REDUCTION: This exam was performed according to the departmental dose-optimization program which includes automated  exposure control, adjustment of the mA and/or kV according to patient size and/or use of iterative reconstruction technique. COMPARISON:  03/24/2023. FINDINGS: Urinary Tract:  No abnormality visualized. Bowel:  Unremarkable visualized pelvic bowel loops. Vascular/Lymphatic: No pathologically enlarged lymph nodes. No significant vascular abnormality seen. Reproductive:  The prostate gland is within normal limits. Other:  No abdominopelvic ascites. Musculoskeletal: No acute fracture or dislocation is seen. Degenerative changes are present in the lower lumbar spine at L5-S1. A corticated bony density is noted in the region of the anterior inferior iliac spine on the right, which is likely chronic. IMPRESSION: No evidence of acute fracture or dislocation. Electronically Signed   By: Thornell Sartorius M.D.   On: 03/24/2023 22:52   CT ANKLE LEFT WO CONTRAST Result Date: 03/24/2023 CLINICAL DATA:  Trauma, fracture. EXAM: CT OF THE LEFT ANKLE WITHOUT CONTRAST TECHNIQUE: Multidetector CT imaging of the left ankle was performed according to the standard protocol. Multiplanar CT image reconstructions were also generated. RADIATION DOSE REDUCTION: This exam was performed according to the departmental dose-optimization program which includes automated exposure control, adjustment of the mA and/or kV according to patient size and/or use of iterative reconstruction technique. COMPARISON:  Left ankle x-ray same day. FINDINGS: Bones/Joint/Cartilage There is a markedly comminuted fracture of the distal tibial diaphysis with vertical components of the fracture extending to the anterior articulating surface of the distal tibia as well as posterior malleolus. There is also markedly comminuted fracture of the distal fibular diaphysis. The tibial fracture is in near anatomic alignment. There is 1 shaft with anterior displacement of the proximal fracture fragment involving the fibular fracture with main fracture fragments distracted 1 cm.  There is abnormal widening of the talotibial joint space laterally. There is no talotibial dislocation. External fixation screws seen traversing the calcaneus. Ligaments Suboptimally assessed by CT. Muscles and Tendons There is intramuscular edema and deep fascial plane edema with air surrounding the fracture sites. Soft tissues There is a radiopaque foreign body in the plantar soft tissues of the midfoot measuring 6 by 5 by 7 mm. There is subcutaneous edema, swelling and air surrounding the fracture sites. IMPRESSION: 1. Markedly comminuted fractures of the distal tibia and fibula as described above. 2. Abnormal widening of the talotibial joint space laterally. 3. Radiopaque foreign body in the plantar soft tissues of the midfoot. 4. External fixation screws traversing the calcaneus. Electronically Signed   By: Darliss Cheney M.D.   On: 03/24/2023 22:48   DG Ankle Complete Left Result Date: 03/24/2023 CLINICAL DATA:  External fixator placement EXAM: LEFT ANKLE COMPLETE - 3+ VIEW COMPARISON:  Films from earlier in the same day. FLUOROSCOPY TIME:  Radiation Exposure Index (as provided by the fluoroscopic device): 0.73 mGy If the device does not provide the exposure index: Fluoroscopy Time:  14  seconds Number of Acquired Images:  2 FINDINGS: External fixator is noted in place with some reduction of the distal tibial and fibular fractures. IMPRESSION: External fixator placement Electronically Signed   By: Alcide Clever M.D.   On: 03/24/2023 21:55   DG C-Arm 1-60 Min-No Report Result Date: 03/24/2023 Fluoroscopy was utilized by the requesting physician.  No radiographic interpretation.   DG Tibia/Fibula Left Port Result Date: 03/24/2023 CLINICAL DATA:  Blunt Trauma.  Fall off ladder. EXAM: PORTABLE LEFT TIBIA AND FIBULA - 2 VIEW COMPARISON:  None Available. FINDINGS: Proximal left leg demonstrates acute minimally displaced left fibular head and neck fracture. Proximal tibia unremarkable. Knee grossly  unremarkable in partially visualized. Please see separately dictated x-ray left ankle [id number] acute fractures. IMPRESSION: 1. Proximal left leg demonstrates acute minimally displaced left fibular head and neck fracture. 2. Please see separately dictated x-ray left ankle 03/24/2023 regarding acute fractures. Electronically Signed   By: Tish Frederickson M.D.   On: 03/24/2023 14:11   DG Pelvis Portable Result Date: 03/24/2023 CLINICAL DATA:  Trauma.  Fell off ladder. EXAM: PORTABLE PELVIS 1-2 VIEWS COMPARISON:  None Available. FINDINGS: No acute displaced fracture or dislocation of the right hip. No dislocation of the left hip. Poorly visualized left femoral neck with underlying fracture not excluded given patient rotation of the hip. There is no evidence of pelvic fracture or diastasis. Left hip subcutaneus soft tissue edema. IMPRESSION: 1. Negative for definite acute traumatic injury. 2. Poorly visualized left femoral neck fracture with associated overlying soft tissue edema changes hip rotation. Recommend dedicated left hip radiographs. Electronically Signed   By: Tish Frederickson M.D.   On: 03/24/2023 14:06   DG Ankle Left Port Result Date: 03/24/2023 CLINICAL DATA:  Blunt Trauma fall off ladder EXAM: PORTABLE LEFT ANKLE - 2 VIEW COMPARISON:  None Available. FINDINGS: Acute nondisplaced medial malleolar fracture. Acute greater than full shaft width displaced and comminuted transsyndesmotic distal fibular fracture. Poorly visualized acute comminuted and displaced posterior malleolar fracture. Open markedly comminuted and laterally displaced distal tibial shaft fracture. Diffuse left ankle subcutaneus soft tissue edema with overlying soft tissue defect and protruding tibial fracture shaft. Talar fracture not excluded. No definite dislocation, possible subluxation of the ankle joint. IMPRESSION: 1. Acute, open, markedly comminuted and laterally displaced distal tibial shaft fracture. 2. Acute, open, comminuted  and laterally displaced trimalleolar fracture. 3. Recommend CT for further evaluation. 4. No definite dislocation, possible subluxation of the ankle joint. Electronically Signed   By: Tish Frederickson M.D.   On: 03/24/2023 14:04   DG Chest Port 1 View Result Date: 03/24/2023 CLINICAL DATA:  Trauma EXAM: PORTABLE CHEST 1 VIEW COMPARISON:  Chest x-ray 04/04/2019 FINDINGS: The heart and mediastinal contours are within normal limits. No focal consolidation. No pulmonary edema. No pleural effusion. No pneumothorax. No acute osseous abnormality. IMPRESSION: No active disease. Electronically Signed   By: Tish Frederickson M.D.   On: 03/24/2023 13:58   CT HEAD WO CONTRAST Result Date: 03/24/2023 CLINICAL DATA:  Fall, poly trauma EXAM: CT HEAD WITHOUT CONTRAST CT CERVICAL SPINE WITHOUT CONTRAST TECHNIQUE: Multidetector CT imaging of the head and cervical spine was performed following the standard protocol without intravenous contrast. Multiplanar CT image reconstructions of the cervical spine were also generated. RADIATION DOSE REDUCTION: This exam was performed according to the departmental dose-optimization program which includes automated exposure control, adjustment of the mA and/or kV according to patient size and/or use of iterative reconstruction technique. COMPARISON:  10/11/2007 CT head, 12/25/2015 CT cervical spine FINDINGS:  CT HEAD FINDINGS Brain: No evidence of acute infarct, hemorrhage, mass, mass effect, or midline shift. No hydrocephalus or extra-axial fluid collection. Vascular: No hyperdense vessel. Skull: Negative for fracture or focal lesion. Sinuses/Orbits: Mucosal thickening in the ethmoid air cells, frontal sinuses, and right maxillary sinus. No acute finding in the orbits. Other: The mastoid air cells are well aerated. CT CERVICAL SPINE FINDINGS Alignment: No traumatic listhesis. Skull base and vertebrae: No acute fracture or suspicious osseous lesion. Soft tissues and spinal canal: No  prevertebral fluid or swelling. No visible canal hematoma. Disc levels: Degenerative changes in the cervical spine.Moderate spinal canal stenosis at C5-C6. Upper chest: No focal pulmonary opacity or pleural effusion. Apical bullae/blebs. IMPRESSION: 1. No acute intracranial process. 2. No acute fracture or traumatic listhesis in the cervical spine. Electronically Signed   By: Wiliam Ke M.D.   On: 03/24/2023 13:31   CT CERVICAL SPINE WO CONTRAST Result Date: 03/24/2023 CLINICAL DATA:  Fall, poly trauma EXAM: CT HEAD WITHOUT CONTRAST CT CERVICAL SPINE WITHOUT CONTRAST TECHNIQUE: Multidetector CT imaging of the head and cervical spine was performed following the standard protocol without intravenous contrast. Multiplanar CT image reconstructions of the cervical spine were also generated. RADIATION DOSE REDUCTION: This exam was performed according to the departmental dose-optimization program which includes automated exposure control, adjustment of the mA and/or kV according to patient size and/or use of iterative reconstruction technique. COMPARISON:  10/11/2007 CT head, 12/25/2015 CT cervical spine FINDINGS: CT HEAD FINDINGS Brain: No evidence of acute infarct, hemorrhage, mass, mass effect, or midline shift. No hydrocephalus or extra-axial fluid collection. Vascular: No hyperdense vessel. Skull: Negative for fracture or focal lesion. Sinuses/Orbits: Mucosal thickening in the ethmoid air cells, frontal sinuses, and right maxillary sinus. No acute finding in the orbits. Other: The mastoid air cells are well aerated. CT CERVICAL SPINE FINDINGS Alignment: No traumatic listhesis. Skull base and vertebrae: No acute fracture or suspicious osseous lesion. Soft tissues and spinal canal: No prevertebral fluid or swelling. No visible canal hematoma. Disc levels: Degenerative changes in the cervical spine.Moderate spinal canal stenosis at C5-C6. Upper chest: No focal pulmonary opacity or pleural effusion. Apical  bullae/blebs. IMPRESSION: 1. No acute intracranial process. 2. No acute fracture or traumatic listhesis in the cervical spine. Electronically Signed   By: Wiliam Ke M.D.   On: 03/24/2023 13:31    Disposition: Discharge disposition: 01-Home or Self Care      Patient requesting to be discharged home.  He understands he will require additional surgery for definitive treatment.  His pain is surprisingly well-controlled and he feels well.  This is reasonable.  Plan to discharge him home today and have him follow-up Monday in clinic for repeat evaluation and discussion of additional surgical intervention.  Discussed the importance of nonweightbearing and soft tissue rest.  He understands.   -Nonweightbearing left lower extremity -Keep external fixator and dressing in place, please reinforce dressing with an Ace wrap if needed. -Oxycodone for pain control at discharge -Aspirin for DVT prophylaxis -Augmentin for open fracture -Crutches ordered.   Again, we will plan to see him Monday in clinic.  He was given our contact information to the office and will call with any questions.  He is understanding of this plan.   Discharge Instructions     Call MD / Call 911   Complete by: As directed    If you experience chest pain or shortness of breath, CALL 911 and be transported to the hospital emergency room.  If you develope  a fever above 101 F, pus (white drainage) or increased drainage or redness at the wound, or calf pain, call your surgeon's office.   Constipation Prevention   Complete by: As directed    Drink plenty of fluids.  Prune juice may be helpful.  You may use a stool softener, such as Colace (over the counter) 100 mg twice a day.  Use MiraLax (over the counter) for constipation as needed.   DME Crutches   Complete by: As directed    Diet - low sodium heart healthy   Complete by: As directed    Non weight bearing   Complete by: As directed    Laterality: left   Extremity: Lower    Post-operative opioid taper instructions:   Complete by: As directed    POST-OPERATIVE OPIOID TAPER INSTRUCTIONS: It is important to wean off of your opioid medication as soon as possible. If you do not need pain medication after your surgery it is ok to stop day one. Opioids include: Codeine, Hydrocodone(Norco, Vicodin), Oxycodone(Percocet, oxycontin) and hydromorphone amongst others.  Long term and even short term use of opiods can cause: Increased pain response Dependence Constipation Depression Respiratory depression And more.  Withdrawal symptoms can include Flu like symptoms Nausea, vomiting And more Techniques to manage these symptoms Hydrate well Eat regular healthy meals Stay active Use relaxation techniques(deep breathing, meditating, yoga) Do Not substitute Alcohol to help with tapering If you have been on opioids for less than two weeks and do not have pain than it is ok to stop all together.  Plan to wean off of opioids This plan should start within one week post op of your joint replacement. Maintain the same interval or time between taking each dose and first decrease the dose.  Cut the total daily intake of opioids by one tablet each day Next start to increase the time between doses. The last dose that should be eliminated is the evening dose.             Signed: Davan Nawabi J [place] 03/25/2023, 8:26 AM

## 2023-03-25 NOTE — Anesthesia Postprocedure Evaluation (Signed)
Anesthesia Post Note  Patient: Thomas [name]  Procedure(s) Performed: Pleas Patricia AND DEBRIDEMENT EXTERNAL FIXATION ANKLE (Left: Ankle)     Patient location during evaluation: PACU Anesthesia Type: General Level of consciousness: awake and alert Pain management: pain level controlled Vital Signs Assessment: post-procedure vital signs reviewed and stable Respiratory status: spontaneous breathing, nonlabored ventilation, respiratory function stable and patient connected to nasal cannula oxygen Cardiovascular status: blood pressure returned to baseline and stable Postop Assessment: no apparent nausea or vomiting Anesthetic complications: no   No notable events documented.  Last Vitals:  Vitals:   03/25/23 0016 03/25/23 0418  BP: 116/72 127/72  Pulse: 70 82  Resp: 18 18  Temp: 36.9 C 36.6 C  SpO2: 97% 98%    Last Pain:  Vitals:   03/25/23 0418  TempSrc: Oral  PainSc:    Pain Goal: Patients Stated Pain Goal: 0 (03/25/23 0016)                 Trevor Iha

## 2023-03-28 ENCOUNTER — Encounter (HOSPITAL_COMMUNITY): Payer: Self-pay | Admitting: Orthopaedic Surgery

## 2023-03-28 NOTE — Progress Notes (Signed)
SDW call  Patient was given pre-op instructions over the phone. Patient verbalized understanding of instructions provided.     PCP - Denies Cardiologist -  Pulmonary:    PPM/ICD - denies Device Orders - na Rep Notified - na   Chest x-ray - na EKG -  na Stress Test - ECHO -  Cardiac Cath -   Sleep Study/sleep apnea/CPAP: denies  Non-diabetic   Blood Thinner Instructions: denies Aspirin Instructions:ASA, last dose 03/27/2023  ERAS Protcol - Clears until 0700   COVID TEST- na    Anesthesia review: No   Patient denies shortness of breath, fever, cough and chest pain over the phone call  Your procedure is scheduled on Tuesday March 29, 2023  Report to [facility] Main Entrance "A" at  0730  A.M., then check in with the Admitting office.  Call this number if you have problems the morning of surgery:  [phone]   If you have any questions prior to your surgery date call [phone]: Open Monday-Friday 8am-4pm If you experience any cold or flu symptoms such as cough, fever, chills, shortness of breath, etc. between now and your scheduled surgery, please notify us at the above number    Remember:  Do not eat after midnight the night before your surgery  You may drink clear liquids until 0700  the morning of your surgery.   Clear liquids allowed are: Water, Non-Citrus Juices (without pulp), Carbonated Beverages, Clear Tea, Black Coffee ONLY (NO MILK, CREAM OR POWDERED CREAMER of any kind), and Gatorade   Take these medicines the morning of surgery with A SIP OF WATER:  Augmentin  As needed: Oxycodone  As of today, STOP taking any Aspirin (unless otherwise instructed by your surgeon) Aleve, Naproxen, Ibuprofen, Motrin, Advil, Goody's, BC's, all herbal medications, fish oil, and all vitamins.

## 2023-03-29 ENCOUNTER — Encounter (HOSPITAL_COMMUNITY)
Admission: RE | Disposition: A | Discharge status: Home / Self Care | Payer: Self-pay | Source: Home / Self Care | Attending: Orthopaedic Surgery

## 2023-03-29 ENCOUNTER — Encounter (HOSPITAL_COMMUNITY): Payer: Self-pay | Admitting: Anesthesiology

## 2023-03-29 ENCOUNTER — Ambulatory Visit (HOSPITAL_COMMUNITY)
Admission: RE | Admit: 2023-03-29 | Discharge: 2023-03-29 | Disposition: A | Discharge status: Home / Self Care | Payer: Self-pay | Attending: Orthopaedic Surgery | Admitting: Orthopaedic Surgery

## 2023-03-29 DIAGNOSIS — Z538 Procedure and treatment not carried out for other reasons: Secondary | ICD-10-CM | POA: Diagnosis not present

## 2023-03-29 DIAGNOSIS — M7989 Other specified soft tissue disorders: Secondary | ICD-10-CM | POA: Insufficient documentation

## 2023-03-29 DIAGNOSIS — X58XXXA Exposure to other specified factors, initial encounter: Secondary | ICD-10-CM | POA: Insufficient documentation

## 2023-03-29 DIAGNOSIS — S82892A Other fracture of left lower leg, initial encounter for closed fracture: Secondary | ICD-10-CM | POA: Insufficient documentation

## 2023-03-29 SURGERY — OPEN REDUCTION INTERNAL FIXATION (ORIF) ANKLE FRACTURE
Anesthesia: General | Site: Ankle | Laterality: Left

## 2023-03-29 MED ORDER — ACETAMINOPHEN 500 MG PO TABS: 1000.0000 mg | ORAL_TABLET | Freq: Once | ORAL | Status: DC

## 2023-03-29 NOTE — Progress Notes (Signed)
Per Dr. Susa Simmonds, today's surgery is cancelled due swelling in the patient's operative leg.

## 2023-04-08 ENCOUNTER — Other Ambulatory Visit: Payer: Self-pay | Admitting: Orthopaedic Surgery

## 2023-04-08 NOTE — Patient Instructions (Addendum)
 SURGICAL WAITING ROOM VISITATION  Patients having surgery or a procedure may have no more than 2 support people in the waiting area - these visitors may rotate.    Children under the age of 49 must have an adult with them who is not the patient.  Due to an increase in RSV and influenza rates and associated hospitalizations, children ages 47 and under may not visit patients in [facility] hospitals.  If the patient needs to stay at the hospital during part of their recovery, the visitor guidelines for inpatient rooms apply. Pre-op nurse will coordinate an appropriate time for 1 support person to accompany patient in pre-op.  This support person may not rotate.    Please refer to the [facility] website for the visitor guidelines for Inpatients (after your surgery is over and you are in a regular room).    Your procedure is scheduled on: 04/12/23   Report to [facility] Main Entrance    Report to admitting at 8:45 AM   Call this number if you have problems the morning of surgery [phone]   Do not eat food :After Midnight.   After Midnight you may have the following liquids until 8:00 AM DAY OF SURGERY  Water Non-Citrus Juices (without pulp, NO RED-Apple, White grape, White cranberry) Black Coffee (NO MILK/CREAM OR CREAMERS, sugar ok)  Clear Tea (NO MILK/CREAM OR CREAMERS, sugar ok) regular and decaf                             Plain Jell-O (NO RED)                                           Fruit ices (not with fruit pulp, NO RED)                                     Popsicles (NO RED)                                                               Sports drinks like Gatorade (NO RED)    The day of surgery:  Drink ONE (1) Pre-Surgery Clear Ensure at 8:00 AM the morning of surgery. Drink in one sitting. Do not sip.  This drink was given to you during your hospital  pre-op appointment visit. Nothing else to drink after completing the  Pre-Surgery Clear Ensure.          If  you have questions, please contact your surgeon's office.   FOLLOW BOWEL PREP AND ANY ADDITIONAL PRE OP INSTRUCTIONS YOU RECEIVED FROM YOUR SURGEON'S OFFICE!!!     Oral Hygiene is also important to reduce your risk of infection.                                    Remember - BRUSH YOUR TEETH THE MORNING OF SURGERY WITH YOUR REGULAR TOOTHPASTE  DENTURES WILL BE REMOVED PRIOR TO SURGERY PLEASE DO NOT APPLY Poly grip OR ADHESIVES!!!  Do NOT smoke after Midnight   Stop all vitamins and herbal supplements 7 days before surgery.   Take these medicines the morning of surgery with A SIP OF WATER: Oxycodone               You may not have any metal on your body including jewelry, and body piercing             Do not wear lotions, powders, cologne, or deodorant              Men may shave face and neck.   Do not bring valuables to the hospital. [facility] IS NOT             RESPONSIBLE   FOR VALUABLES.   Contacts, glasses, dentures or bridgework may not be worn into surgery.   Bring small overnight bag day of surgery.   DO NOT BRING YOUR HOME MEDICATIONS TO THE HOSPITAL. PHARMACY WILL DISPENSE MEDICATIONS LISTED ON YOUR MEDICATION LIST TO YOU DURING YOUR ADMISSION IN THE HOSPITAL!              Please read over the following fact sheets you were given: IF YOU HAVE QUESTIONS ABOUT YOUR PRE-OP INSTRUCTIONS PLEASE CALL [phone]    If you received a COVID test during your pre-op visit  it is requested that you wear a mask when out in public, stay away from anyone that may not be feeling well and notify your surgeon if you develop symptoms. If you test positive for Covid or have been in contact with anyone that has tested positive in the last 10 days please notify you surgeon.    [facility] - Preparing for Surgery Before surgery, you can play an important role.  Because skin is not sterile, your skin needs to be as free of germs as possible.  You can reduce the number of germs on  your skin by washing with CHG (chlorahexidine gluconate) soap before surgery.  CHG is an antiseptic cleaner which kills germs and bonds with the skin to continue killing germs even after washing. Please DO NOT use if you have an allergy to CHG or antibacterial soaps.  If your skin becomes reddened/irritated stop using the CHG and inform your nurse when you arrive at Short Stay. Do not shave (including legs and underarms) for at least 48 hours prior to the first CHG shower.  You may shave your face/neck.  Please follow these instructions carefully:  1.  Shower with CHG Soap the night before surgery and the  morning of surgery.  2.  If you choose to wash your hair, wash your hair first as usual with your normal  shampoo.  3.  After you shampoo, rinse your hair and body thoroughly to remove the shampoo.                             4.  Use CHG as you would any other liquid soap.  You can apply chg directly to the skin and wash.  Gently with a scrungie or clean washcloth.  5.  Apply the CHG Soap to your body ONLY FROM THE NECK DOWN.   Do   not use on face/ open                           Wound or open sores. Avoid contact with eyes, ears mouth and   genitals (  private parts).                       Wash face,  Genitals (private parts) with your normal soap.             6.  Wash thoroughly, paying special attention to the area where your    surgery  will be performed.  7.  Thoroughly rinse your body with warm water from the neck down.  8.  DO NOT shower/wash with your normal soap after using and rinsing off the CHG Soap.                9.  Pat yourself dry with a clean towel.            10.  Wear clean pajamas.            11.  Place clean sheets on your bed the night of your first shower and do not  sleep with pets. Day of Surgery : Do not apply any lotions/deodorants the morning of surgery.  Please wear clean clothes to the hospital/surgery center.  FAILURE TO FOLLOW THESE INSTRUCTIONS MAY RESULT IN THE  CANCELLATION OF YOUR SURGERY  PATIENT SIGNATURE_________________________________  NURSE SIGNATURE__________________________________  ________________________________________________________________________  Thomas [name]  An incentive spirometer is a tool that can help keep your lungs clear and active. This tool measures how well you are filling your lungs with each breath. Taking long deep breaths may help reverse or decrease the chance of developing breathing (pulmonary) problems (especially infection) following: A long period of time when you are unable to move or be active. BEFORE THE PROCEDURE  If the spirometer includes an indicator to show your best effort, your nurse or respiratory therapist will set it to a desired goal. If possible, sit up straight or lean slightly forward. Try not to slouch. Hold the incentive spirometer in an upright position. INSTRUCTIONS FOR USE  Sit on the edge of your bed if possible, or sit up as far as you can in bed or on a chair. Hold the incentive spirometer in an upright position. Breathe out normally. Place the mouthpiece in your mouth and seal your lips tightly around it. Breathe in slowly and as deeply as possible, raising the piston or the ball toward the top of the column. Hold your breath for 3-5 seconds or for as long as possible. Allow the piston or ball to fall to the bottom of the column. Remove the mouthpiece from your mouth and breathe out normally. Rest for a few seconds and repeat Steps 1 through 7 at least 10 times every 1-2 hours when you are awake. Take your time and take a few normal breaths between deep breaths. The spirometer may include an indicator to show your best effort. Use the indicator as a goal to work toward during each repetition. After each set of 10 deep breaths, practice coughing to be sure your lungs are clear. If you have an incision (the cut made at the time of surgery), support your incision when coughing by  placing a pillow or rolled up towels firmly against it. Once you are able to get out of bed, walk around indoors and cough well. You may stop using the incentive spirometer when instructed by your caregiver.  RISKS AND COMPLICATIONS Take your time so you do not get dizzy or light-headed. If you are in pain, you may need to take or ask for pain medication before doing incentive spirometry. It is  harder to take a deep breath if you are having pain. AFTER USE Rest and breathe slowly and easily. It can be helpful to keep track of a log of your progress. Your caregiver can provide you with a simple table to help with this. If you are using the spirometer at home, follow these instructions: SEEK MEDICAL CARE IF:  You are having difficultly using the spirometer. You have trouble using the spirometer as often as instructed. Your pain medication is not giving enough relief while using the spirometer. You develop fever of 100.5 F (38.1 C) or higher. SEEK IMMEDIATE MEDICAL CARE IF:  You cough up bloody sputum that had not been present before. You develop fever of 102 F (38.9 C) or greater. You develop worsening pain at or near the incision site. MAKE SURE YOU:  Understand these instructions. Will watch your condition. Will get help right away if you are not doing well or get worse. Document Released: 08/02/2006 Document Revised: 06/14/2011 Document Reviewed: 10/03/2006 [facility] Patient Information 2014 [place], [place].   ________________________________________________________________________

## 2023-04-08 NOTE — Progress Notes (Addendum)
 COVID Vaccine Completed:  Date of COVID positive in last 90 days: no  PCP - n/a Cardiologist - n/a  Chest x-ray - 03/24/23 EKG - n/a Stress Test - n/a ECHO -  n/a Cardiac Cath - n/a Pacemaker/ICD device last checked: n/a Spinal Cord Stimulator: n/a  Bowel Prep - no  Sleep Study - n/a CPAP -   Fasting Blood Sugar - n/a Checks Blood Sugar _____ times a day  Last dose of GLP1 agonist-  N/A GLP1 instructions:  Hold 7 days before surgery    Last dose of SGLT-2 inhibitors-  N/A SGLT-2 instructions:  Hold 3 days before surgery    Blood Thinner Instructions:  Time Aspirin  Instructions: n/a Last Dose:  Activity level: Can go up a flight of stairs and perform activities of daily living without stopping and without symptoms of chest pain or shortness of breath. Currently using crutches   Anesthesia review:   Patient denies shortness of breath, fever, cough and chest pain at PAT appointment  Patient verbalized understanding of instructions that were given to them at the PAT appointment. Patient was also instructed that they will need to review over the PAT instructions again at home before surgery.

## 2023-04-11 ENCOUNTER — Other Ambulatory Visit: Payer: Self-pay

## 2023-04-11 ENCOUNTER — Encounter (HOSPITAL_COMMUNITY): Payer: Self-pay

## 2023-04-11 ENCOUNTER — Encounter (HOSPITAL_COMMUNITY)
Admission: RE | Admit: 2023-04-11 | Discharge: 2023-04-11 | Disposition: A | Discharge status: Home / Self Care | Payer: Self-pay | Source: Ambulatory Visit | Attending: Orthopaedic Surgery | Admitting: Orthopaedic Surgery

## 2023-04-11 VITALS — BP 131/89 | HR 98 | Temp 98.0°F | Resp 16 | Ht 74.0 in | Wt 197.0 lb

## 2023-04-11 DIAGNOSIS — Z01818 Encounter for other preprocedural examination: Secondary | ICD-10-CM

## 2023-04-11 DIAGNOSIS — Z01812 Encounter for preprocedural laboratory examination: Secondary | ICD-10-CM | POA: Insufficient documentation

## 2023-04-11 LAB — CBC
HCT: 36.2 % — ABNORMAL LOW (ref 39.0–52.0)
Hemoglobin: 12 g/dL — ABNORMAL LOW (ref 13.0–17.0)
MCH: 29.3 pg (ref 26.0–34.0)
MCHC: 33.1 g/dL (ref 30.0–36.0)
MCV: 88.5 fL (ref 80.0–100.0)
Platelets: 388 10*3/uL (ref 150–400)
RBC: 4.09 MIL/uL — ABNORMAL LOW (ref 4.22–5.81)
RDW: 14.6 % (ref 11.5–15.5)
WBC: 7.4 10*3/uL (ref 4.0–10.5)
nRBC: 0 % (ref 0.0–0.2)

## 2023-04-12 ENCOUNTER — Encounter (HOSPITAL_COMMUNITY)
Admission: RE | Disposition: A | Discharge status: Home / Self Care | Payer: Self-pay | Source: Home / Self Care | Attending: Orthopaedic Surgery

## 2023-04-12 ENCOUNTER — Ambulatory Visit (HOSPITAL_COMMUNITY): Payer: Medicaid Other

## 2023-04-12 ENCOUNTER — Ambulatory Visit (HOSPITAL_COMMUNITY): Payer: Medicaid Other | Admitting: Anesthesiology

## 2023-04-12 ENCOUNTER — Ambulatory Visit (HOSPITAL_BASED_OUTPATIENT_CLINIC_OR_DEPARTMENT_OTHER): Payer: Medicaid Other | Admitting: Anesthesiology

## 2023-04-12 ENCOUNTER — Encounter (HOSPITAL_COMMUNITY): Payer: Self-pay | Admitting: Orthopaedic Surgery

## 2023-04-12 ENCOUNTER — Other Ambulatory Visit: Payer: Self-pay

## 2023-04-12 ENCOUNTER — Ambulatory Visit (HOSPITAL_COMMUNITY)
Admission: RE | Admit: 2023-04-12 | Discharge: 2023-04-12 | Disposition: A | Discharge status: Home / Self Care | Payer: Medicaid Other | Attending: Orthopaedic Surgery | Admitting: Orthopaedic Surgery

## 2023-04-12 DIAGNOSIS — S93432A Sprain of tibiofibular ligament of left ankle, initial encounter: Secondary | ICD-10-CM | POA: Insufficient documentation

## 2023-04-12 DIAGNOSIS — S82872A Displaced pilon fracture of left tibia, initial encounter for closed fracture: Secondary | ICD-10-CM

## 2023-04-12 DIAGNOSIS — F17210 Nicotine dependence, cigarettes, uncomplicated: Secondary | ICD-10-CM | POA: Diagnosis not present

## 2023-04-12 DIAGNOSIS — S82402A Unspecified fracture of shaft of left fibula, initial encounter for closed fracture: Secondary | ICD-10-CM | POA: Diagnosis not present

## 2023-04-12 DIAGNOSIS — W11XXXA Fall on and from ladder, initial encounter: Secondary | ICD-10-CM | POA: Insufficient documentation

## 2023-04-12 DIAGNOSIS — S82831B Other fracture of upper and lower end of right fibula, initial encounter for open fracture type I or II: Secondary | ICD-10-CM

## 2023-04-12 HISTORY — PX: SYNDESMOSIS REPAIR: SHX5182

## 2023-04-12 HISTORY — PX: ORIF ANKLE FRACTURE: SHX5408

## 2023-04-12 HISTORY — PX: EXTERNAL FIXATION REMOVAL: SHX5040

## 2023-04-12 SURGERY — OPEN REDUCTION INTERNAL FIXATION (ORIF) ANKLE FRACTURE
Anesthesia: Regional | Site: Ankle | Laterality: Left

## 2023-04-12 MED ORDER — HYDROMORPHONE HCL 1 MG/ML IJ SOLN
INTRAMUSCULAR | Status: AC
  Filled 2023-04-12: qty 1

## 2023-04-12 MED ORDER — PROPOFOL 10 MG/ML IV BOLUS
INTRAVENOUS | Status: DC | PRN
  Administered 2023-04-12 (×2): 200 mg via INTRAVENOUS

## 2023-04-12 MED ORDER — DEXAMETHASONE SODIUM PHOSPHATE 10 MG/ML IJ SOLN: INTRAMUSCULAR | Status: DC | PRN

## 2023-04-12 MED ORDER — CEFAZOLIN SODIUM-DEXTROSE 2-4 GM/100ML-% IV SOLN
2.0000 g | INTRAVENOUS | Status: AC
  Administered 2023-04-12: 2 g via INTRAVENOUS
  Filled 2023-04-12: qty 100

## 2023-04-12 MED ORDER — BUPIVACAINE-EPINEPHRINE (PF) 0.5% -1:200000 IJ SOLN
INTRAMUSCULAR | Status: DC | PRN
  Administered 2023-04-12: 30 mL via PERINEURAL

## 2023-04-12 MED ORDER — ACETAMINOPHEN 500 MG PO TABS
1000.0000 mg | ORAL_TABLET | Freq: Once | ORAL | Status: AC
  Administered 2023-04-12: 1000 mg via ORAL
  Filled 2023-04-12: qty 2

## 2023-04-12 MED ORDER — AMISULPRIDE (ANTIEMETIC) 5 MG/2ML IV SOLN: 10.0000 mg | Freq: Once | INTRAVENOUS | Status: AC | PRN

## 2023-04-12 MED ORDER — DEXAMETHASONE SODIUM PHOSPHATE 10 MG/ML IJ SOLN
INTRAMUSCULAR | Status: AC
  Filled 2023-04-12: qty 1

## 2023-04-12 MED ORDER — ORAL CARE MOUTH RINSE: 15.0000 mL | Freq: Once | OROMUCOSAL | Status: AC

## 2023-04-12 MED ORDER — AMISULPRIDE (ANTIEMETIC) 5 MG/2ML IV SOLN
INTRAVENOUS | Status: AC
  Administered 2023-04-12: 10 mg via INTRAVENOUS
  Filled 2023-04-12: qty 4

## 2023-04-12 MED ORDER — SODIUM CHLORIDE 0.9 % IV SOLN: 12.5000 mg | INTRAVENOUS | Status: DC | PRN

## 2023-04-12 MED ORDER — FENTANYL CITRATE (PF) 100 MCG/2ML IJ SOLN
INTRAMUSCULAR | Status: AC
  Filled 2023-04-12: qty 2

## 2023-04-12 MED ORDER — LIDOCAINE HCL (CARDIAC) PF 100 MG/5ML IV SOSY
PREFILLED_SYRINGE | INTRAVENOUS | Status: DC | PRN
  Administered 2023-04-12: 40 mg via INTRAVENOUS

## 2023-04-12 MED ORDER — CEFAZOLIN SODIUM-DEXTROSE 2-4 GM/100ML-% IV SOLN: 2.0000 g | INTRAVENOUS | Status: DC

## 2023-04-12 MED ORDER — LIDOCAINE HCL (PF) 2 % IJ SOLN
INTRAMUSCULAR | Status: AC
  Filled 2023-04-12: qty 5

## 2023-04-12 MED ORDER — ONDANSETRON HCL 4 MG/2ML IJ SOLN
INTRAMUSCULAR | Status: AC
  Filled 2023-04-12: qty 2

## 2023-04-12 MED ORDER — OXYCODONE HCL 5 MG/5ML PO SOLN: 5.0000 mg | Freq: Once | ORAL | Status: DC | PRN

## 2023-04-12 MED ORDER — PROPOFOL 10 MG/ML IV BOLUS
INTRAVENOUS | Status: AC
  Filled 2023-04-12: qty 20

## 2023-04-12 MED ORDER — 0.9 % SODIUM CHLORIDE (POUR BTL) OPTIME
TOPICAL | Status: DC | PRN
  Administered 2023-04-12: 1000 mL

## 2023-04-12 MED ORDER — FENTANYL CITRATE (PF) 100 MCG/2ML IJ SOLN
INTRAMUSCULAR | Status: DC | PRN
  Administered 2023-04-12 (×3): 50 ug via INTRAVENOUS

## 2023-04-12 MED ORDER — HYDROMORPHONE HCL 1 MG/ML IJ SOLN
0.2500 mg | INTRAMUSCULAR | Status: DC | PRN
Start: 2023-04-12 — End: 2023-04-12
  Administered 2023-04-12 (×4): 0.5 mg via INTRAVENOUS

## 2023-04-12 MED ORDER — ESMOLOL HCL 100 MG/10ML IV SOLN
INTRAVENOUS | Status: AC
  Filled 2023-04-12: qty 10

## 2023-04-12 MED ORDER — CHLORHEXIDINE GLUCONATE 0.12 % MT SOLN
15.0000 mL | Freq: Once | OROMUCOSAL | Status: AC
  Administered 2023-04-12: 15 mL via OROMUCOSAL

## 2023-04-12 MED ORDER — MIDAZOLAM HCL 2 MG/2ML IJ SOLN
1.0000 mg | Freq: Once | INTRAMUSCULAR | Status: AC
  Administered 2023-04-12: 2 mg via INTRAVENOUS
  Filled 2023-04-12: qty 2

## 2023-04-12 MED ORDER — CELECOXIB 200 MG PO CAPS
200.0000 mg | ORAL_CAPSULE | Freq: Once | ORAL | Status: AC
  Administered 2023-04-12: 200 mg via ORAL
  Filled 2023-04-12: qty 1

## 2023-04-12 MED ORDER — CYCLOBENZAPRINE HCL 5 MG PO TABS: ORAL_TABLET | ORAL | 0 refills | Status: AC

## 2023-04-12 MED ORDER — DEXAMETHASONE SODIUM PHOSPHATE 10 MG/ML IJ SOLN
INTRAMUSCULAR | Status: DC | PRN
  Administered 2023-04-12: 8 mg via INTRAVENOUS

## 2023-04-12 MED ORDER — POVIDONE-IODINE 10 % EX SWAB
2.0000 | Freq: Once | CUTANEOUS | Status: AC
  Administered 2023-04-12: 2 via TOPICAL

## 2023-04-12 MED ORDER — LACTATED RINGERS IV SOLN: INTRAVENOUS | Status: DC

## 2023-04-12 MED ORDER — OXYCODONE HCL 5 MG PO TABS: ORAL_TABLET | ORAL | 0 refills | Status: AC

## 2023-04-12 MED ORDER — ASPIRIN 325 MG PO TABS: ORAL_TABLET | ORAL | 0 refills | Status: AC

## 2023-04-12 MED ORDER — ONDANSETRON HCL 4 MG/2ML IJ SOLN
INTRAMUSCULAR | Status: DC | PRN
  Administered 2023-04-12: 4 mg via INTRAVENOUS

## 2023-04-12 MED ORDER — POVIDONE-IODINE 10 % EX SWAB: 2.0000 | Freq: Once | CUTANEOUS | Status: DC

## 2023-04-12 MED ORDER — OXYCODONE HCL 5 MG PO TABS
5.0000 mg | ORAL_TABLET | Freq: Once | ORAL | Status: DC | PRN
Start: 2023-04-12 — End: 2023-04-12

## 2023-04-12 MED ORDER — FENTANYL CITRATE PF 50 MCG/ML IJ SOSY
50.0000 ug | PREFILLED_SYRINGE | Freq: Once | INTRAMUSCULAR | Status: DC
  Filled 2023-04-12: qty 2

## 2023-04-12 MED ORDER — ESMOLOL HCL 100 MG/10ML IV SOLN
INTRAVENOUS | Status: DC | PRN
  Administered 2023-04-12 (×3): 20 mg via INTRAVENOUS

## 2023-04-12 SURGICAL SUPPLY — 74 items
ALCOHOL 70% 16 OZ (MISCELLANEOUS) ×2 IMPLANT
BAG COUNTER SPONGE SURGICOUNT (BAG) ×2 IMPLANT
BANDAGE ESMARK 6X9 LF (GAUZE/BANDAGES/DRESSINGS) IMPLANT
BIT DRILL LONG 2.5 (BIT) IMPLANT
BIT DRILL SHORT 2.0 ZI (BIT) IMPLANT
BIT DRILL SHORT 2.5 (BIT) IMPLANT
BIT DRL SHORT 2.5 (BIT) ×2 IMPLANT
BLADE SURG 15 STRL LF DISP TIS (BLADE) ×2 IMPLANT
BNDG COHESIVE 4X5 TAN STRL (GAUZE/BANDAGES/DRESSINGS) IMPLANT
BNDG COHESIVE 6X5 TAN ST LF (GAUZE/BANDAGES/DRESSINGS) IMPLANT
BNDG ELASTIC 6X10 VLCR STRL LF (GAUZE/BANDAGES/DRESSINGS) ×2 IMPLANT
BNDG ESMARK 6X9 LF (GAUZE/BANDAGES/DRESSINGS) IMPLANT
CANISTER SUCT 3000ML PPV (MISCELLANEOUS) ×2 IMPLANT
CHIP CORTICOCANCELLOUS 30CC (Bone Implant) ×2 IMPLANT
CHLORAPREP W/TINT 26 (MISCELLANEOUS) ×4 IMPLANT
COVER SURGICAL LIGHT HANDLE (MISCELLANEOUS) ×2 IMPLANT
CUFF TOURN SGL QUICK 42 (TOURNIQUET CUFF) IMPLANT
CUFF TRNQT CYL 34X4.125X (TOURNIQUET CUFF) ×2 IMPLANT
DRAPE C-ARMOR (DRAPES) IMPLANT
DRAPE OEC MINIVIEW 54X84 (DRAPES) ×2 IMPLANT
DRAPE U-SHAPE 47X51 STRL (DRAPES) ×2 IMPLANT
DRILL LONG 2.5 (BIT) ×2 IMPLANT
DRIVER RETENTION T15 LONG (ORTHOPEDIC DISPOSABLE SUPPLIES) IMPLANT
DRSG MEPITEL 4X7.2 (GAUZE/BANDAGES/DRESSINGS) ×2 IMPLANT
DRSG XEROFORM 1X8 (GAUZE/BANDAGES/DRESSINGS) ×2 IMPLANT
ELECT REM PT RETURN 9FT ADLT (ELECTROSURGICAL) ×2 IMPLANT
ELECTRODE REM PT RTRN 9FT ADLT (ELECTROSURGICAL) ×2 IMPLANT
GAUZE PAD ABD 8X10 STRL (GAUZE/BANDAGES/DRESSINGS) ×4 IMPLANT
GAUZE SPONGE 4X4 12PLY STRL (GAUZE/BANDAGES/DRESSINGS) IMPLANT
GAUZE SPONGE 4X4 12PLY STRL LF (GAUZE/BANDAGES/DRESSINGS) ×2 IMPLANT
GAUZE XEROFORM 1X8 LF (GAUZE/BANDAGES/DRESSINGS) IMPLANT
GLOVE BIOGEL M STRL SZ7.5 (GLOVE) ×2 IMPLANT
GLOVE BIOGEL PI IND STRL 8 (GLOVE) ×2 IMPLANT
GLOVE SRG 8 PF TXTR STRL LF DI (GLOVE) ×2 IMPLANT
GLOVE SURG ENC TEXT LTX SZ7.5 (GLOVE) ×2 IMPLANT
GOWN STRL REUS W/ TWL LRG LVL3 (GOWN DISPOSABLE) ×2 IMPLANT
GOWN STRL REUS W/ TWL XL LVL3 (GOWN DISPOSABLE) ×4 IMPLANT
GRAFT BNE CORT CANC CHIPS 30CC (Bone Implant) IMPLANT
K-WIRE ALPS MXV 1.6X6 ZI (WIRE) ×8 IMPLANT
KIT BASIN OR (CUSTOM PROCEDURE TRAY) ×2 IMPLANT
KIT TURNOVER KIT B (KITS) ×2 IMPLANT
KWIRE ALPS MXV 1.6X6 ZI (WIRE) IMPLANT
NS IRRIG 1000ML POUR BTL (IV SOLUTION) ×2 IMPLANT
PACK ORTHO EXTREMITY (CUSTOM PROCEDURE TRAY) ×2 IMPLANT
PAD ARMBOARD 7.5X6 YLW CONV (MISCELLANEOUS) ×4 IMPLANT
PAD CAST 4YDX4 CTTN HI CHSV (CAST SUPPLIES) ×2 IMPLANT
PADDING CAST COTTON 6X4 STRL (CAST SUPPLIES) IMPLANT
PADDING CAST SYNTHETIC 6X4 NS (CAST SUPPLIES) IMPLANT
PLATE ANTL TIB NRW 10H LT (Plate) IMPLANT
PLATE LATERAL FIB 10H LT (Plate) IMPLANT
PUTTY DBM STAGRAFT PLUS 5CC (Putty) IMPLANT
SCREW LOCK MD 3.5X44 (Screw) IMPLANT
SCREW LOCK MDS 2.710 (Screw) IMPLANT
SCREW LOCK MDS 2.7X14 (Screw) IMPLANT
SCREW LOCK MDS 2.7X18 (Screw) IMPLANT
SCREW LOCK MDS 2.7X20 (Screw) IMPLANT
SCREW LOCK MDS 3.5X50 (Screw) IMPLANT
SCREW LOCK MDS 3.5X55 (Screw) IMPLANT
SCREW NL3.5X32 (Screw) IMPLANT
SCREW NL3.5X50 (Screw) IMPLANT
SCREW NLOCK 3.5X30 (Screw) IMPLANT
SCREW NLOCK 3.5X60 (Screw) IMPLANT
SCREW NLOCK ALPS 3.5X14 (Screw) IMPLANT
SCREW NLOCK MDS 3.5X48 (Screw) IMPLANT
SCREW NON-LOCK 3.5X16 (Screw) IMPLANT
SPLINT PLASTER CAST XFAST 5X30 (CAST SUPPLIES) IMPLANT
SPONGE T-LAP 18X18 ~~LOC~~+RFID (SPONGE) ×2 IMPLANT
SUCTION TUBE FRAZIER 10FR DISP (SUCTIONS) ×2 IMPLANT
SUT ETHILON 3 0 PS 1 (SUTURE) ×2 IMPLANT
SUT MNCRL AB 3-0 PS2 27 (SUTURE) ×2 IMPLANT
SUT VIC AB 2-0 CT1 TAPERPNT 27 (SUTURE) ×4 IMPLANT
TOWEL GREEN STERILE (TOWEL DISPOSABLE) ×2 IMPLANT
TOWEL GREEN STERILE FF (TOWEL DISPOSABLE) ×2 IMPLANT
TUBE CONNECTING 12X1/4 (SUCTIONS) ×2 IMPLANT

## 2023-04-12 NOTE — Anesthesia Procedure Notes (Signed)
 Procedure Name: LMA Insertion Date/Time: 04/12/2023 11:51 AM  Performed by: Zulema Leita PARAS, CRNAPre-anesthesia Checklist: Patient identified, Emergency Drugs available, Suction available and Patient being monitored Oxygen Delivery Method: Simple face mask Preoxygenation: Pre-oxygenation with 100% oxygen Induction Type: IV induction LMA: LMA with gastric port inserted LMA Size: 4.0 Number of attempts: 1 Placement Confirmation: positive ETCO2 and breath sounds checked- equal and bilateral Tube secured with: Tape Dental Injury: Teeth and Oropharynx as per pre-operative assessment

## 2023-04-12 NOTE — Anesthesia Preprocedure Evaluation (Addendum)
 Anesthesia Evaluation  Patient identified by MRN, date of birth, ID band Patient awake    Reviewed: Allergy & Precautions, NPO status , Patient's Chart, lab work & pertinent test results  History of Anesthesia Complications Negative for: history of anesthetic complications  Airway Mallampati: II  TM Distance: >3 FB Neck ROM: Full    Dental no notable dental hx.    Pulmonary asthma , Current Smoker and Patient abstained from smoking.   Pulmonary exam normal        Cardiovascular negative cardio ROS Normal cardiovascular exam     Neuro/Psych negative neurological ROS  negative psych ROS   GI/Hepatic negative GI ROS, Neg liver ROS,,,  Endo/Other  negative endocrine ROS    Renal/GU negative Renal ROS     Musculoskeletal   Abdominal   Peds  Hematology  (+) Blood dyscrasia, anemia   Anesthesia Other Findings LEFT COMMINUTED PILON ANKLE FRACTURE WITH ASSOCIATED FIBULA FRACTURE OPEN  Reproductive/Obstetrics                             Anesthesia Physical Anesthesia Plan  ASA: 2  Anesthesia Plan: General and Regional   Post-op Pain Management: Tylenol  PO (pre-op)*, Celebrex  PO (pre-op)* and Regional block*   Induction: Intravenous  PONV Risk Score and Plan: 1 and Treatment may vary due to age or medical condition, Ondansetron , Dexamethasone  and Midazolam   Airway Management Planned: LMA  Additional Equipment:   Intra-op Plan:   Post-operative Plan: Extubation in OR  Informed Consent: I have reviewed the patients History and Physical, chart, labs and discussed the procedure including the risks, benefits and alternatives for the proposed anesthesia with the patient or authorized representative who has indicated his/her understanding and acceptance.     Dental advisory given  Plan Discussed with: CRNA and Anesthesiologist  Anesthesia Plan Comments:        Anesthesia Quick  Evaluation

## 2023-04-12 NOTE — Transfer of Care (Signed)
 Immediate Anesthesia Transfer of Care Note  Patient: Thomas [name]  Procedure(s) Performed: OPEN REDUCTION INTERNAL FIXATION (ORIF)  OF LEFT PILON ANKLE FRACTURE WITH FIBULA, EXTERNALFIXATOR REMOVAL, OPEN TREATMENT OF SYNDESMOSIS (Left: Ankle) REMOVAL EXTERNAL FIXATION (Left) OPEN TREATMENT OF SYNDESMOSIS (Left)  Patient Location: PACU  Anesthesia Type:General  Level of Consciousness: drowsy  Airway & Oxygen Therapy: Patient Spontanous Breathing and Patient connected to face mask oxygen  Post-op Assessment: Report given to RN and Post -op Vital signs reviewed and stable  Post vital signs: Reviewed and stable  Last Vitals:  Vitals Value Taken Time  BP 148/93 04/12/23 1452  Temp    Pulse 88 04/12/23 1456  Resp 10 04/12/23 1456  SpO2 100 % 04/12/23 1456  Vitals shown include unfiled device data.  Last Pain:  Vitals:   04/12/23 1100  TempSrc:   PainSc: 0-No pain         Complications: No notable events documented.

## 2023-04-12 NOTE — Anesthesia Procedure Notes (Signed)
 Anesthesia Regional Block: Popliteal block   Pre-Anesthetic Checklist: , timeout performed,  Correct Patient, Correct Site, Correct Laterality,  Correct Procedure, Correct Position, site marked,  Risks and benefits discussed,  Surgical consent,  Pre-op evaluation,  At surgeon's request and post-op pain management  Laterality: Left  Prep: chloraprep       Needles:  Injection technique: Single-shot  Needle Type: Echogenic Stimulator Needle     Needle Length: 10cm  Needle Gauge: 20     Additional Needles:   Procedures:,,,, ultrasound used (permanent image in chart),,    Narrative:  Start time: 04/12/2023 10:20 AM End time: 04/12/2023 10:30 AM Injection made incrementally with aspirations every 5 mL.  Performed by: Personally  Anesthesiologist: Patrisha Bernardino SQUIBB, MD  Additional Notes: Functioning IV was confirmed and monitors were applied.  A timeout was performed. Sterile prep, hand hygiene and sterile gloves were used. A 20ga Bbraun echogenic stimulator needle was used. Negative aspiration and negative test dose prior to incremental administration of local anesthetic. The patient tolerated the procedure well.  Ultrasound guidance: relevent anatomy identified, needle position confirmed, local anesthetic spread visualized around nerve(s), vascular puncture avoided.  Image printed for medical record.

## 2023-04-12 NOTE — Discharge Instructions (Signed)
 DR. ELSA [facility] ANKLE SURGERY POST-OP INSTRUCTIONS   Pain Management The numbing medicine and your leg will last around 18 hours, take a dose of your pain medicine as soon as you feel it wearing off to avoid rebound pain. Keep your foot elevated above heart level.  Make sure that your heel hangs free ('floats'). Take all prescribed medication as directed. If taking narcotic pain medication you may want to use an over-the-counter stool softener to avoid constipation. You may take over-the-counter NSAIDs (ibuprofen, naproxen, etc.) as well as over-the-counter acetaminophen  as directed on the packaging as a supplement for your pain and may also use it to wean away from the prescription medication.  Activity Non-weightbearing Keep splint intact  First Postoperative Visit Your first postop visit will be at least 2 weeks after surgery.  This should be scheduled when you schedule surgery. If you do not have a postoperative visit scheduled please call [phone] to schedule an appointment. At the appointment your incision will be evaluated for suture removal, x-rays will be obtained if necessary.  General Instructions Swelling is very common after foot and ankle surgery.  It often takes 3 months for the foot and ankle to begin to feel comfortable.  Some amount of swelling will persist for 6-12 months. DO NOT change the dressing.  If there is a problem with the dressing (too tight, loose, gets wet, etc.) please contact Dr. Isiah office. DO NOT get the dressing wet.  For showers you can use an over-the-counter cast cover or wrap a washcloth around the top of your dressing and then cover it with a plastic bag and tape it to your leg. DO NOT soak the incision (no tubs, pools, bath, etc.) until you have approval from Dr. ELSA.  Contact Dr. Nadara office or go to Emergency Room if: Temperature above 101 F. Increasing pain that is unresponsive to pain medication or elevation Excessive redness or  swelling in your foot Dressing problems - excessive bloody drainage, looseness or tightness, or if dressing gets wet Develop pain, swelling, warmth, or discoloration of your calf

## 2023-04-12 NOTE — H&P (Signed)
 PREOPERATIVE H&P  Chief Complaint: Left intra-articular distal tibia fracture with associated fibula fracture and syndesmosis disruption status post external fixator placement with washout.  HPI: Thomas [name] is a 43 y.o. male who presents for preoperative history and physical with a diagnosis of the above.  He sustained this after a fall from a ladder.  He went emergently to the OR for washout and external fixator placement.  He had significant swelling and therefore a period of soft tissue rest occurred.  He is here today for surgery.. Symptoms are rated as moderate to severe, and have been worsening.  This is significantly impairing activities of daily living.  He has elected for surgical management.   Past Medical History:  Diagnosis Date   Allergy    Asthma    Low back pain    Past Surgical History:  Procedure Laterality Date   EXTERNAL FIXATION LEG Left 03/24/2023   Procedure: IRRIGAION AND DEBRIDEMENT EXTERNAL FIXATION ANKLE;  Surgeon: Elsa Lonni SAUNDERS, MD;  Location: [facility] OR;  Service: Orthopedics;  Laterality: Left;   HAND SURGERY Right    Social History   Socioeconomic History   Marital status: Single    Spouse name: Not on file   Number of children: Not on file   Years of education: Not on file   Highest education level: Not on file  Occupational History   Not on file  Tobacco Use   Smoking status: Every Day    Current packs/day: 0.25    Types: Cigarettes   Smokeless tobacco: Never  Vaping Use   Vaping status: Never Used  Substance and Sexual Activity   Alcohol use: No    Alcohol/week: 0.0 standard drinks of alcohol   Drug use: No   Sexual activity: Not Currently  Other Topics Concern   Not on file  Social History Narrative   Not on file   Social Drivers of Health   Financial Resource Strain: Not on file  Food Insecurity: No Food Insecurity (03/24/2023)   Hunger Vital Sign    Worried About Running Out of Food in the Last Year: Never true    Ran  Out of Food in the Last Year: Never true  Transportation Needs: No Transportation Needs (03/24/2023)   PRAPARE - Administrator, Civil Service (Medical): No    Lack of Transportation (Non-Medical): No  Physical Activity: Not on file  Stress: Not on file  Social Connections: Not on file   Family History  Problem Relation Age of Onset   Diabetes Mother    No Known Allergies Prior to Admission medications   Medication Sig Start Date End Date Taking? Authorizing Provider  amoxicillin -clavulanate (AUGMENTIN ) 875-125 MG tablet Take 1 tablet by mouth 2 (two) times daily. For 10 days 03/25/23   Jordan, Jesse J, PA-C  aspirin  (BAYER ASPIRIN ) 325 MG tablet Take 1 tablet by mouth twice daily for 30 DAYS for blood clot prevention Patient not taking: Reported on 04/11/2023 03/25/23   Jordan, Jesse J, PA-C  cyclobenzaprine  (FLEXERIL ) 5 MG tablet Take 5-10 mg by mouth every 8 (eight) hours as needed. 03/29/23   [provider]  oxyCODONE  (OXY IR/ROXICODONE ) 5 MG immediate release tablet Take 1-2 tablets by mouth every 4 to 6 hours as needed for pain 03/25/23   Jordan, Josefa PARAS, PA-C     Positive ROS: All other systems have been reviewed and were otherwise negative with the exception of those mentioned in the HPI and as above.  Physical Exam:  Vitals:   04/12/23 0931  BP: 129/80  Pulse: 85  Resp: 18  Temp: 99.3 F (37.4 C)  SpO2: 97%   General: Alert, no acute distress Cardiovascular: No pedal edema Respiratory: No cyanosis, no use of accessory musculature GI: No organomegaly, abdomen is soft and non-tender Skin: No lesions in the area of chief complaint Neurologic: Sensation intact distally Psychiatric: Patient is competent for consent with normal mood and affect Lymphatic: No axillary or cervical lymphadenopathy  MUSCULOSKELETAL: Left lower extremity with external fixator in place.  Vast improvement in swelling.  Tender to palpation about the leg.  Wound is healing  well.  No sign of infection.  Foot is warm and well-perfused with intact sensation.  Assessment: Left intra-articular distal tibia fracture with associated fibula fracture.  There is significant joint involvement and bony impaction.   Plan: Plan for open treatment of his fractures.  May require syndesmotic fixation.  Will remove the external fixator as well.  Patient likely be discharged from the PACU..  We discussed the risks, benefits and alternatives of surgery which include but are not limited to wound healing complications, infection, nonunion, malunion, need for further surgery, damage to surrounding structures and continued pain.  They understand there is no guarantees to an acceptable outcome.  After weighing these risks they opted to proceed with surgery.     Lonni JONELLE Pae, MD    04/12/2023 9:36 AM

## 2023-04-13 NOTE — Anesthesia Postprocedure Evaluation (Signed)
 Anesthesia Post Note  Patient: Thomas [name]  Procedure(s) Performed: OPEN REDUCTION INTERNAL FIXATION (ORIF)  OF LEFT PILON ANKLE FRACTURE WITH FIBULA, EXTERNALFIXATOR REMOVAL, OPEN TREATMENT OF SYNDESMOSIS (Left: Ankle) REMOVAL EXTERNAL FIXATION (Left) OPEN TREATMENT OF SYNDESMOSIS (Left)     Patient location during evaluation: PACU Anesthesia Type: Regional and General Level of consciousness: awake Pain management: pain level controlled Vital Signs Assessment: post-procedure vital signs reviewed and stable Respiratory status: spontaneous breathing, nonlabored ventilation and respiratory function stable Cardiovascular status: blood pressure returned to baseline and stable Postop Assessment: no apparent nausea or vomiting Anesthetic complications: no   No notable events documented.  Last Vitals:  Vitals:   04/12/23 1545 04/12/23 1608  BP: 138/76 (!) 144/86  Pulse: 86 83  Resp: 12 15  Temp: 36.6 C 36.5 C  SpO2: 95% 98%    Last Pain:  Vitals:   04/12/23 1608  TempSrc:   PainSc: 0-No pain                 Thomas [name]
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
## 2023-04-15 ENCOUNTER — Encounter (HOSPITAL_COMMUNITY): Payer: Self-pay | Admitting: Orthopaedic Surgery

## 2023-05-03 ENCOUNTER — Encounter (HOSPITAL_COMMUNITY): Payer: Self-pay | Admitting: Orthopaedic Surgery

## 2023-08-01 ENCOUNTER — Other Ambulatory Visit: Payer: Self-pay

## 2023-08-01 ENCOUNTER — Emergency Department (HOSPITAL_COMMUNITY)

## 2023-08-01 ENCOUNTER — Encounter (HOSPITAL_COMMUNITY): Payer: Self-pay

## 2023-08-01 ENCOUNTER — Emergency Department (HOSPITAL_COMMUNITY)
Admission: EM | Admit: 2023-08-01 | Discharge: 2023-08-01 | Disposition: A | Discharge status: Home / Self Care | Attending: Emergency Medicine | Admitting: Emergency Medicine

## 2023-08-01 DIAGNOSIS — W2203XA Walked into furniture, initial encounter: Secondary | ICD-10-CM | POA: Diagnosis not present

## 2023-08-01 DIAGNOSIS — S9002XA Contusion of left ankle, initial encounter: Secondary | ICD-10-CM | POA: Diagnosis not present

## 2023-08-01 DIAGNOSIS — M25572 Pain in left ankle and joints of left foot: Secondary | ICD-10-CM | POA: Diagnosis present

## 2023-08-01 DIAGNOSIS — Z7982 Long term (current) use of aspirin: Secondary | ICD-10-CM | POA: Insufficient documentation

## 2023-08-01 MED ORDER — TRAMADOL HCL 50 MG PO TABS: 50.0000 mg | ORAL_TABLET | Freq: Four times a day (QID) | ORAL | 0 refills | Status: AC | PRN

## 2023-08-01 MED ORDER — OXYCODONE-ACETAMINOPHEN 5-325 MG PO TABS
1.0000 | ORAL_TABLET | Freq: Once | ORAL | Status: AC
  Administered 2023-08-01: 1 via ORAL
  Filled 2023-08-01: qty 1

## 2023-08-01 MED ORDER — TRAMADOL HCL 50 MG PO TABS: 50.0000 mg | ORAL_TABLET | Freq: Four times a day (QID) | ORAL | 0 refills | Status: DC | PRN

## 2023-08-01 NOTE — Discharge Instructions (Addendum)
 It looks like one of your screws is broken please follow-up with your surgeon to discuss recommendations. Ice and elevate and minimize walking on until you see orthopedics.

## 2023-08-01 NOTE — ED Triage Notes (Signed)
 Pt c.o left ankle pain after hitting it on a dresser earlier today. Hx of ORIF surgery earlier this year.
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
## 2023-08-02 NOTE — Progress Notes (Signed)
 Orthopedic Tech Progress Note Patient Details:  Thomas [name] September 23, 1980 [id number] Gave pt crutches per order.  Ortho Devices Type of Ortho Device: Crutches Ortho Device/Splint Location: BLE Ortho Device/Splint Interventions: Ordered, Application, Adjustment   Post Interventions Patient Tolerated: Well Instructions Provided: Adjustment of device, Care of device, Poper ambulation with device  Rayna Calkin 08/02/2023, 1:05 AM
